# Patient Record
Sex: Female | Born: 1985 | Race: White | Hispanic: Yes | Marital: Single | Smoking: Never smoker
Health system: Southern US, Community
[De-identification: ages and names within clinical notes are randomized; demographics above are authoritative.]

## PROBLEM LIST (undated history)

## (undated) ENCOUNTER — Inpatient Hospital Stay (HOSPITAL_COMMUNITY): Payer: Self-pay

## (undated) DIAGNOSIS — D249 Benign neoplasm of unspecified breast: Secondary | ICD-10-CM

## (undated) HISTORY — DX: Benign neoplasm of unspecified breast: D24.9

## (undated) HISTORY — PX: NO PAST SURGERIES: SHX2092

---
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
## 2009-08-04 ENCOUNTER — Encounter: Admission: RE | Admit: 2009-08-04 | Discharge: 2009-08-04 | Payer: Self-pay | Admitting: Family Medicine

## 2011-11-18 ENCOUNTER — Emergency Department (HOSPITAL_COMMUNITY)
Admission: EM | Admit: 2011-11-18 | Discharge: 2011-11-18 | Disposition: A | Discharge status: Home / Self Care | Payer: Self-pay | Attending: Emergency Medicine | Admitting: Emergency Medicine

## 2011-11-18 ENCOUNTER — Encounter (HOSPITAL_COMMUNITY): Payer: Self-pay | Admitting: Emergency Medicine

## 2011-11-18 DIAGNOSIS — R109 Unspecified abdominal pain: Secondary | ICD-10-CM | POA: Insufficient documentation

## 2011-11-18 DIAGNOSIS — M549 Dorsalgia, unspecified: Secondary | ICD-10-CM | POA: Insufficient documentation

## 2011-11-18 LAB — URINALYSIS, ROUTINE W REFLEX MICROSCOPIC
Nitrite: NEGATIVE
pH: 7.5 (ref 5.0–8.0)

## 2011-11-18 LAB — COMPREHENSIVE METABOLIC PANEL
AST: 19 U/L (ref 0–37)
Albumin: 4.2 g/dL (ref 3.5–5.2)
Alkaline Phosphatase: 69 U/L (ref 39–117)
Calcium: 9.5 mg/dL (ref 8.4–10.5)
Chloride: 102 mEq/L (ref 96–112)
GFR calc non Af Amer: >90 mL/min (ref >90)
Glucose, Bld: 101 mg/dL — ABNORMAL HIGH (ref 70–99)
Total Bilirubin: 0.4 mg/dL (ref 0.3–1.2)

## 2011-11-18 LAB — CBC WITH DIFFERENTIAL/PLATELET
Basophils Absolute: 0 10*3/uL (ref 0.0–0.1)
Basophils Relative: 0 % (ref 0–1)
Eosinophils Absolute: 0.1 10*3/uL (ref 0.0–0.7)
Hemoglobin: 11.9 g/dL — ABNORMAL LOW (ref 12.0–15.0)
Lymphocytes Relative: 57 % — ABNORMAL HIGH (ref 12–46)
Monocytes Absolute: 0.4 10*3/uL (ref 0.1–1.0)
Monocytes Relative: 8 % (ref 3–12)
RBC: 4.26 MIL/uL (ref 3.87–5.11)

## 2011-11-18 NOTE — ED Notes (Signed)
Pt. Stated, I started having stomach pain and back pain that started yesterday

## 2011-11-18 NOTE — ED Notes (Signed)
Report of wait time.  Blankets offered

## 2013-02-22 ENCOUNTER — Inpatient Hospital Stay (HOSPITAL_COMMUNITY)
Admission: AD | Admit: 2013-02-22 | Discharge: 2013-02-22 | Disposition: A | Discharge status: Home / Self Care | Payer: Medicaid Other | Source: Ambulatory Visit | Attending: Obstetrics & Gynecology | Admitting: Obstetrics & Gynecology

## 2013-02-22 ENCOUNTER — Inpatient Hospital Stay (HOSPITAL_COMMUNITY): Payer: Medicaid Other

## 2013-02-22 ENCOUNTER — Encounter (HOSPITAL_COMMUNITY): Payer: Self-pay | Admitting: *Deleted

## 2013-02-22 ENCOUNTER — Inpatient Hospital Stay (HOSPITAL_COMMUNITY): Payer: Self-pay

## 2013-02-22 DIAGNOSIS — O9989 Other specified diseases and conditions complicating pregnancy, childbirth and the puerperium: Secondary | ICD-10-CM

## 2013-02-22 DIAGNOSIS — N949 Unspecified condition associated with female genital organs and menstrual cycle: Secondary | ICD-10-CM | POA: Insufficient documentation

## 2013-02-22 DIAGNOSIS — O99891 Other specified diseases and conditions complicating pregnancy: Secondary | ICD-10-CM | POA: Insufficient documentation

## 2013-02-22 DIAGNOSIS — R109 Unspecified abdominal pain: Secondary | ICD-10-CM | POA: Insufficient documentation

## 2013-02-22 DIAGNOSIS — O26899 Other specified pregnancy related conditions, unspecified trimester: Secondary | ICD-10-CM

## 2013-02-22 LAB — URINALYSIS, ROUTINE W REFLEX MICROSCOPIC
Bilirubin Urine: NEGATIVE
Glucose, UA: NEGATIVE mg/dL
Hgb urine dipstick: NEGATIVE
Nitrite: NEGATIVE
Protein, ur: NEGATIVE mg/dL
Specific Gravity, Urine: 1.015 (ref 1.005–1.030)
Urobilinogen, UA: 0.2 mg/dL (ref 0.0–1.0)
pH: 6 (ref 5.0–8.0)

## 2013-02-22 LAB — WET PREP, GENITAL
Clue Cells Wet Prep HPF POC: NONE SEEN
Yeast Wet Prep HPF POC: NONE SEEN

## 2013-02-22 LAB — CBC
MCHC: 33.8 g/dL (ref 30.0–36.0)
Platelets: 263 10*3/uL (ref 150–400)
RBC: 3.78 MIL/uL — ABNORMAL LOW (ref 3.87–5.11)
RDW: 12.9 % (ref 11.5–15.5)
WBC: 4.7 10*3/uL (ref 4.0–10.5)

## 2013-02-22 MED ORDER — ACETAMINOPHEN 500 MG PO TABS: 1000.0000 mg | ORAL_TABLET | Freq: Once | ORAL | Status: DC

## 2013-02-22 MED ORDER — PROMETHAZINE HCL 25 MG PO TABS: 25.0000 mg | ORAL_TABLET | Freq: Four times a day (QID) | ORAL | Status: DC | PRN

## 2013-02-22 NOTE — MAU Provider Note (Signed)
Attestation of Attending Supervision of Advanced Practitioner (PA/CNM/NP): Evaluation and management procedures were performed by the Advanced Practitioner under my supervision and collaboration.  I have reviewed the Advanced Practitioner's note and chart, and I agree with the management and plan.  Alliyah Roesler, MD, FACOG Attending Obstetrician & Gynecologist Faculty Practice, [facility]  

## 2013-02-22 NOTE — MAU Note (Signed)
C/o pelvic pain over bladder since this morning;

## 2013-02-22 NOTE — MAU Provider Note (Signed)
History     CSN: [id number]  Arrival date and time: 02/22/13 1532  First Provider Initiated Contact with Patient 02/22/13 1643    Chief Complaint  Patient presents with  . Pelvic Pain   Pelvic Pain The patient's primary symptoms include pelvic pain. Associated symptoms include nausea and vomiting. Pertinent negatives include no chills, dysuria, fever, frequency, headaches, sore throat or urgency.    A translator was used during this interview.  Ms. Brenda Washington is a 27 y/o G1P0 female that is 8w pregnant. Patient presents for left suprapelvic pain that started this morning. She say the pain feels like an intense period cramp. It ranges in intensity from 5/10 to 7/10. She has not tried any medications to relieve the pain. She says there is a clear discharge from her vagina that has soaked two pads. She also complains of N/V that starts in the morning and persists throughout the day. She states that she has been constipated for the past two weeks and her last BM was this morning.  OB History   Grav Para Term Preterm Abortions TAB SAB Ect Mult Living   1               Past Medical History  Diagnosis Date  . Anemia     History reviewed. No pertinent past surgical history.  Family History  Problem Relation Age of Onset  . Alcohol abuse Neg Hx   . Arthritis Neg Hx   . Asthma Neg Hx   . Birth defects Neg Hx   . Cancer Neg Hx   . COPD Neg Hx   . Depression Neg Hx   . Diabetes Neg Hx   . Drug abuse Neg Hx   . Early death Neg Hx   . Hearing loss Neg Hx   . Heart disease Neg Hx   . Hyperlipidemia Neg Hx   . Hypertension Neg Hx   . Kidney disease Neg Hx   . Learning disabilities Neg Hx   . Mental illness Neg Hx   . Mental retardation Neg Hx   . Miscarriages / Stillbirths Neg Hx   . Stroke Neg Hx   . Vision loss Neg Hx     History  Substance Use Topics  . Smoking status: Never Smoker   . Smokeless tobacco: Not on file  . Alcohol Use: No    Allergies:  Allergies   Allergen Reactions  . Iron Shortness Of Breath    Patient experienced a reaction to the iron supplement prescribed to her.    Prescriptions prior to admission  Medication Sig Dispense Refill  . Prenatal Vit-Fe Fumarate-FA (PRENATAL MULTIVITAMIN) TABS tablet Take 1 tablet by mouth daily at 12 noon.        Review of Systems  Constitutional: Negative for fever, chills and weight loss.  HENT: Negative for sore throat.   Respiratory: Negative for shortness of breath and wheezing.   Cardiovascular: Negative for chest pain and palpitations.  Gastrointestinal: Positive for nausea and vomiting. Negative for heartburn.  Genitourinary: Positive for pelvic pain. Negative for dysuria, urgency and frequency.  Neurological: Negative for headaches.   Physical Exam   Blood pressure 121/60, pulse 95, temperature 98.1 F (36.7 C), temperature source Oral, last menstrual period 12/26/2012.  Physical Exam  Constitutional: She is oriented to person, place, and time. She appears well-developed and well-nourished. No distress.  HENT:  Head: Normocephalic and atraumatic.  Cardiovascular: Normal rate, regular rhythm, normal heart sounds and intact distal pulses.   Respiratory:  Effort normal and breath sounds normal.  GI: Soft. Bowel sounds are normal. She exhibits no mass. There is tenderness. There is no rebound and no guarding.  Neurological: She is alert and oriented to person, place, and time.  Skin: Skin is warm and dry. No rash noted. She is not diaphoretic. No erythema. No pallor.  Psychiatric: She has a normal mood and affect.    MAU Course  Procedures   Assessment and Plan   UA shows patient is pregnant. Will do a serum hCG followed by and [place] to look for uterine pregnancy and viability. Pelvic exam will be performed to inspect cervix, look for discharge, culture for microbial growth, and not the size of uterus and ovaries.   Will prescribe colace for the patient along with encouraging a  diet higher in fiber and hydration. Also will refer patient to [facility] for prenatal care and start her on an iron free prenatal vitamin.   Assessment: 1. Abdominal pain complicating pregnancy, antepartum    Plan: D/C home in stable condition. Comfort measures and Tylenol PRN.  Follow-up Information   Follow up with [facility].      Follow up with [facility]. (as needed in emergencies)    Contact information:   [id number] [place] [address] [phone]        Medication List         prenatal multivitamin Tabs tablet  Take 1 tablet by mouth daily at 12 noon.     promethazine 25 MG tablet  Commonly known as:  PHENERGAN  Take 1 tablet (25 mg total) by mouth every 6 (six) hours as needed for nausea.       Dorathy Kinsman 02/22/2013, 6:29 PM

## 2013-02-23 LAB — GC/CHLAMYDIA PROBE AMP
CT Probe RNA: NEGATIVE
GC Probe RNA: NEGATIVE

## 2013-03-11 ENCOUNTER — Encounter (HOSPITAL_COMMUNITY): Payer: Self-pay

## 2013-03-11 ENCOUNTER — Inpatient Hospital Stay (HOSPITAL_COMMUNITY)
Admission: AD | Admit: 2013-03-11 | Discharge: 2013-03-11 | Disposition: A | Discharge status: Home / Self Care | Payer: Medicaid Other | Source: Ambulatory Visit | Attending: Obstetrics & Gynecology | Admitting: Obstetrics & Gynecology

## 2013-03-11 DIAGNOSIS — O2390 Unspecified genitourinary tract infection in pregnancy, unspecified trimester: Secondary | ICD-10-CM | POA: Insufficient documentation

## 2013-03-11 DIAGNOSIS — R42 Dizziness and giddiness: Secondary | ICD-10-CM | POA: Insufficient documentation

## 2013-03-11 DIAGNOSIS — N390 Urinary tract infection, site not specified: Secondary | ICD-10-CM | POA: Insufficient documentation

## 2013-03-11 DIAGNOSIS — O219 Vomiting of pregnancy, unspecified: Secondary | ICD-10-CM

## 2013-03-11 DIAGNOSIS — O210 Mild hyperemesis gravidarum: Secondary | ICD-10-CM | POA: Insufficient documentation

## 2013-03-11 LAB — POCT PREGNANCY, URINE: Preg Test, Ur: POSITIVE — AB

## 2013-03-11 LAB — URINE MICROSCOPIC-ADD ON

## 2013-03-11 LAB — URINALYSIS, ROUTINE W REFLEX MICROSCOPIC
Bilirubin Urine: NEGATIVE
Glucose, UA: NEGATIVE mg/dL
Ketones, ur: NEGATIVE mg/dL
pH: 7 (ref 5.0–8.0)

## 2013-03-11 MED ORDER — PROMETHAZINE HCL 25 MG PO TABS: 25.0000 mg | ORAL_TABLET | Freq: Four times a day (QID) | ORAL | Status: DC | PRN

## 2013-03-11 MED ORDER — NITROFURANTOIN MONOHYD MACRO 100 MG PO CAPS: 100.0000 mg | ORAL_CAPSULE | Freq: Two times a day (BID) | ORAL | Status: DC

## 2013-03-11 MED ORDER — ONDANSETRON HCL 4 MG PO TABS: 4.0000 mg | ORAL_TABLET | Freq: Three times a day (TID) | ORAL | Status: DC | PRN

## 2013-03-11 MED ORDER — ACETAMINOPHEN 325 MG PO TABS
650.0000 mg | ORAL_TABLET | Freq: Once | ORAL | Status: AC
  Administered 2013-03-11: 650 mg via ORAL
  Filled 2013-03-11: qty 2

## 2013-03-11 MED ORDER — ONDANSETRON 4 MG PO TBDP
4.0000 mg | ORAL_TABLET | Freq: Once | ORAL | Status: AC
  Administered 2013-03-11: 4 mg via ORAL
  Filled 2013-03-11: qty 1

## 2013-03-11 NOTE — MAU Provider Note (Signed)
History     CSN: [id number]  Arrival date and time: 03/11/13 1330   First Provider Initiated Contact with Patient 03/11/13 1427      Chief Complaint  Patient presents with  . Generalized Body Aches  . Headache  . Emesis   HPI  Ms. Brenda Washington is a 27 y.o. female G1P0 at [redacted]w[redacted]d who presents today with body aches, headache and vomiting.  [employer] line used # [id number]. She complains of dizziness, bodyaches and increased temperature for 4 days. She never measured her Fever so she does not know how high it got. She denies travel recently. She states she has been vomiting morning and evening. She denies abdominal pain or bleeding; no abnormal vaginal discharge. She has not tried anything for the HA today.   OB History   Grav Para Term Preterm Abortions TAB SAB Ect Mult Living   1         0      History reviewed. No pertinent past medical history.  History reviewed. No pertinent past surgical history.  History reviewed. No pertinent family history.  History  Substance Use Topics  . Smoking status: Never Smoker   . Smokeless tobacco: Not on file  . Alcohol Use: No    Allergies:  Allergies  Allergen Reactions  . Iron Anaphylaxis    Prescriptions prior to admission  Medication Sig Dispense Refill  . Prenatal Vit-Fe Fumarate-FA (PRENATAL MULTIVITAMIN) TABS tablet Take 1 tablet by mouth daily at 12 noon. Pt sometimes has shortness of breath when using this med.( see allergy)      . promethazine (PHENERGAN) 25 MG tablet Take 25 mg by mouth every 6 (six) hours as needed for nausea.       Results for orders placed during the hospital encounter of 03/11/13 (from the past 24 hour(s))  URINALYSIS, ROUTINE W REFLEX MICROSCOPIC     Status: Abnormal   Collection Time    03/11/13  1:55 PM      Result Value Range   Color, Urine YELLOW  YELLOW   APPearance CLOUDY (*) CLEAR   Specific Gravity, Urine 1.020  1.005 - 1.030   pH 7.0  5.0 - 8.0   Glucose, UA NEGATIVE  NEGATIVE  mg/dL   Hgb urine dipstick NEGATIVE  NEGATIVE   Bilirubin Urine NEGATIVE  NEGATIVE   Ketones, ur NEGATIVE  NEGATIVE mg/dL   Protein, ur NEGATIVE  NEGATIVE mg/dL   Urobilinogen, UA 0.2  0.0 - 1.0 mg/dL   Nitrite POSITIVE (*) NEGATIVE   Leukocytes, UA TRACE (*) NEGATIVE  URINE MICROSCOPIC-ADD ON     Status: Abnormal   Collection Time    03/11/13  1:55 PM      Result Value Range   Squamous Epithelial / LPF MANY (*) RARE   WBC, UA 3-6  <3 WBC/hpf   Bacteria, UA MANY (*) RARE   Urine-Other MUCOUS PRESENT    POCT PREGNANCY, URINE     Status: Abnormal   Collection Time    03/11/13  2:06 PM      Result Value Range   Preg Test, Ur POSITIVE (*) NEGATIVE   Review of Systems  Constitutional: Positive for chills. Negative for fever.  Gastrointestinal: Positive for nausea and vomiting. Negative for abdominal pain, diarrhea and constipation.  Genitourinary: Positive for frequency. Negative for dysuria and urgency.       No vaginal discharge. No vaginal bleeding. No dysuria.   Neurological: Positive for headaches.   Physical Exam   Blood  pressure 104/55, pulse 88, temperature 98.9 F (37.2 C), resp. rate 16, height 5' 3.5" (1.613 m), weight 125 lb 3.2 oz (56.79 kg), last menstrual period 12/26/2012, SpO2 100.00%.  Physical Exam  Constitutional: She is oriented to person, place, and time. She appears well-developed and well-nourished. No distress.  Eyes: Pupils are equal, round, and reactive to light.  Neck: Neck supple.  Cardiovascular: Normal rate.   Respiratory: Effort normal.  GI: Soft. She exhibits no distension. There is no tenderness. There is no rebound and no guarding.  Neurological: She is alert and oriented to person, place, and time.  Skin: Skin is warm and dry. She is not diaphoretic.    MAU Course  Procedures None  MDM Zofran 4 mg PO ODT Tylenol 650 mg PO   Patient tolerated PO crackers and fluids prior to discharge home.  Patient rates her headache 1/10 prior to  discharge   Assessment and Plan  A: Urinary tract infection Nausea and vomiting complicating pregnancy Headache.   P:  Discharge home RX: Macrobid        Zofran        Phenergan Return to MAU as needed, if symptoms worsen. Return for any abdominal pain or vaginal bleeding Take antibiotics until course is complete [facility] as soon as possible  [name] IRENE FNP-C  03/11/2013, 8:04 PM

## 2013-03-11 NOTE — MAU Note (Signed)
Patient states she has been feeling generalized body pain, headache for about 4 days. Has been vomiting for 3 weeks. Denies bleeding or discharge. Feels cold. States her urine smells bad.

## 2013-03-12 ENCOUNTER — Encounter (HOSPITAL_COMMUNITY): Payer: Self-pay

## 2013-03-13 LAB — URINE CULTURE

## 2013-04-08 ENCOUNTER — Encounter (HOSPITAL_COMMUNITY): Payer: Self-pay

## 2013-04-08 ENCOUNTER — Inpatient Hospital Stay (HOSPITAL_COMMUNITY)
Admission: AD | Admit: 2013-04-08 | Discharge: 2013-04-08 | Disposition: A | Discharge status: Home / Self Care | Payer: Medicaid Other | Source: Ambulatory Visit | Attending: Family Medicine | Admitting: Family Medicine

## 2013-04-08 DIAGNOSIS — K529 Noninfective gastroenteritis and colitis, unspecified: Secondary | ICD-10-CM

## 2013-04-08 DIAGNOSIS — O99891 Other specified diseases and conditions complicating pregnancy: Secondary | ICD-10-CM | POA: Insufficient documentation

## 2013-04-08 DIAGNOSIS — K5289 Other specified noninfective gastroenteritis and colitis: Secondary | ICD-10-CM

## 2013-04-08 DIAGNOSIS — O210 Mild hyperemesis gravidarum: Secondary | ICD-10-CM | POA: Insufficient documentation

## 2013-04-08 DIAGNOSIS — R109 Unspecified abdominal pain: Secondary | ICD-10-CM | POA: Insufficient documentation

## 2013-04-08 DIAGNOSIS — R197 Diarrhea, unspecified: Secondary | ICD-10-CM | POA: Insufficient documentation

## 2013-04-08 LAB — WET PREP, GENITAL: Clue Cells Wet Prep HPF POC: NONE SEEN

## 2013-04-08 LAB — URINALYSIS, ROUTINE W REFLEX MICROSCOPIC
Bilirubin Urine: NEGATIVE
Ketones, ur: 15 mg/dL — AB
Nitrite: NEGATIVE
pH: 8 (ref 5.0–8.0)

## 2013-04-08 MED ORDER — PROMETHAZINE HCL 25 MG PO TABS: 25.0000 mg | ORAL_TABLET | Freq: Four times a day (QID) | ORAL | Status: DC | PRN

## 2013-04-08 MED ORDER — ONDANSETRON 8 MG/NS 50 ML IVPB
8.0000 mg | Freq: Once | INTRAVENOUS | Status: AC
  Administered 2013-04-08: 8 mg via INTRAVENOUS
  Filled 2013-04-08: qty 8

## 2013-04-08 MED ORDER — LACTATED RINGERS IV BOLUS (SEPSIS)
1000.0000 mL | Freq: Once | INTRAVENOUS | Status: AC
  Administered 2013-04-08: 1000 mL via INTRAVENOUS

## 2013-04-08 NOTE — MAU Provider Note (Signed)
Chart reviewed and agree with management and plan.  

## 2013-04-08 NOTE — MAU Note (Signed)
Pt states via Eda, that she has noted pink blood when wiping only this am, and has had sharp pain x2 hours. Pain is slowly going away, rates 6/10. Was unable to walk because pain was so severe. Pain in lower abdominal area, began on left. Denies abnormal vaginal discharge prior to today. Had u/s at 6 weeks here. Also had vomiting with pain.

## 2013-04-08 NOTE — MAU Note (Signed)
Pt has had n/v/d x2 days.

## 2013-04-08 NOTE — MAU Note (Signed)
Urine has foul odor, pt states had uti 3 weeks ago

## 2013-04-08 NOTE — MAU Provider Note (Signed)
History     CSN: [id number]  Arrival date and time: 04/08/13 0844   None     Chief Complaint  Patient presents with  . Abdominal Pain  . Vaginal Bleeding   HPI  Ms. Brenda Washington is a 27 y.o. female G1P0 at Gestational Age: <None> who presents with N/V/D that started this morning. She has vomited three times and has had two episodes of diarrhea. She denies pain, however did noticed a small amount of spotting this morning when she wiped. She denies any other members of her family sick at this time.  Interpretor at bedside.   OB History   Grav Para Term Preterm Abortions TAB SAB Ect Mult Living   1         0      Past Medical History  Diagnosis Date  . Anemia     Past Surgical History  Procedure Laterality Date  . No past surgeries      Family History  Problem Relation Age of Onset  . Alcohol abuse Neg Hx   . Arthritis Neg Hx   . Asthma Neg Hx   . Birth defects Neg Hx   . Cancer Neg Hx   . COPD Neg Hx   . Depression Neg Hx   . Diabetes Neg Hx   . Drug abuse Neg Hx   . Early death Neg Hx   . Hearing loss Neg Hx   . Heart disease Neg Hx   . Hyperlipidemia Neg Hx   . Hypertension Neg Hx   . Kidney disease Neg Hx   . Learning disabilities Neg Hx   . Mental illness Neg Hx   . Mental retardation Neg Hx   . Miscarriages / Stillbirths Neg Hx   . Stroke Neg Hx   . Vision loss Neg Hx     History  Substance Use Topics  . Smoking status: Never Smoker   . Smokeless tobacco: Not on file  . Alcohol Use: No    Allergies:  Allergies  Allergen Reactions  . Iron Anaphylaxis and Shortness Of Breath    Patient experienced a reaction to the iron supplement prescribed to her.    No prescriptions prior to admission   Results for orders placed during the hospital encounter of 04/08/13 (from the past 24 hour(s))  URINALYSIS, ROUTINE W REFLEX MICROSCOPIC     Status: Abnormal   Collection Time    04/08/13  9:25 AM      Result Value Range   Color, Urine  YELLOW  YELLOW   APPearance HAZY (*) CLEAR   Specific Gravity, Urine 1.025  1.005 - 1.030   pH 8.0  5.0 - 8.0   Glucose, UA NEGATIVE  NEGATIVE mg/dL   Hgb urine dipstick NEGATIVE  NEGATIVE   Bilirubin Urine NEGATIVE  NEGATIVE   Ketones, ur 15 (*) NEGATIVE mg/dL   Protein, ur NEGATIVE  NEGATIVE mg/dL   Urobilinogen, UA 0.2  0.0 - 1.0 mg/dL   Nitrite NEGATIVE  NEGATIVE   Leukocytes, UA NEGATIVE  NEGATIVE  WET PREP, GENITAL     Status: Abnormal   Collection Time    04/08/13  9:59 AM      Result Value Range   Yeast Wet Prep HPF POC NONE SEEN  NONE SEEN   Trich, Wet Prep NONE SEEN  NONE SEEN   Clue Cells Wet Prep HPF POC NONE SEEN  NONE SEEN   WBC, Wet Prep HPF POC FEW (*) NONE SEEN  Review of Systems  Constitutional: Positive for chills. Negative for fever.  Gastrointestinal: Positive for nausea, vomiting and diarrhea. Negative for heartburn and abdominal pain.  Genitourinary: Negative for dysuria, urgency, frequency and hematuria.       No vaginal discharge. scant vaginal bleeding; when she wipes No dysuria.   Neurological: Negative for headaches.   Physical Exam   Blood pressure 108/73, pulse 83, temperature 97.8 F (36.6 C), temperature source Oral, resp. rate 18, last menstrual period 12/26/2012. Fetal heart tones by doppler 163 bpm   Physical Exam  Constitutional: She is oriented to person, place, and time. She appears well-developed and well-nourished. No distress.  HENT:  Head: Normocephalic.  Eyes: Pupils are equal, round, and reactive to light.  Neck: Neck supple.  Cardiovascular: Normal rate.   GI: Soft. She exhibits no distension. There is no tenderness. There is no rebound and no guarding.  Genitourinary: No vaginal discharge found.  Speculum exam: Vagina - Small amount of creamy discharge, no odor Cervix - No contact bleeding, no active bleeding, ectropion noted Bimanual exam: Cervix closed Uterus non tender, normal size for gestational age Adnexa non  tender, no masses bilaterally GC/Chlam, wet prep done Chaperone present for exam.   Neurological: She is alert and oriented to person, place, and time.  Skin: Skin is warm. She is not diaphoretic.  Psychiatric: Her behavior is normal.    MAU Course  Procedures None   MDM +fht LR bolus Zofran 8 mg IVPB Pt tolerated PO fluids and crackers. No episodes of vomiting or diarrhea in MAU.   Assessment and Plan   A:  1. Acute gastroenteritis    P: Discharge home RX: Phenergan  Return to MAU as needed, if symptoms worsen BRAT diet discussed  Gatorade for re-hydration  Merle Cirelli IRENE NP  04/08/2013, 10:51 AM

## 2013-04-09 LAB — GC/CHLAMYDIA PROBE AMP
CT Probe RNA: NEGATIVE
GC Probe RNA: NEGATIVE

## 2013-04-25 ENCOUNTER — Other Ambulatory Visit (HOSPITAL_COMMUNITY): Payer: Self-pay | Admitting: Family

## 2013-04-25 DIAGNOSIS — Z0489 Encounter for examination and observation for other specified reasons: Secondary | ICD-10-CM

## 2013-04-25 LAB — OB RESULTS CONSOLE RPR: RPR: NONREACTIVE

## 2013-04-25 LAB — OB RESULTS CONSOLE ABO/RH: RH Type: NEGATIVE

## 2013-04-25 LAB — OB RESULTS CONSOLE GC/CHLAMYDIA
CHLAMYDIA, DNA PROBE: NEGATIVE
GC PROBE AMP, GENITAL: NEGATIVE

## 2013-04-25 LAB — OB RESULTS CONSOLE HEPATITIS B SURFACE ANTIGEN: Hepatitis B Surface Ag: NEGATIVE

## 2013-04-25 LAB — OB RESULTS CONSOLE HIV ANTIBODY (ROUTINE TESTING): HIV: NONREACTIVE

## 2013-04-25 LAB — OB RESULTS CONSOLE ANTIBODY SCREEN: ANTIBODY SCREEN: NEGATIVE

## 2013-04-25 LAB — OB RESULTS CONSOLE RUBELLA ANTIBODY, IGM: Rubella: IMMUNE

## 2013-05-13 ENCOUNTER — Ambulatory Visit (HOSPITAL_COMMUNITY)
Admission: RE | Admit: 2013-05-13 | Discharge: 2013-05-13 | Disposition: A | Discharge status: Home / Self Care | Payer: Medicaid Other | Source: Ambulatory Visit | Attending: Family | Admitting: Family

## 2013-05-13 ENCOUNTER — Other Ambulatory Visit (HOSPITAL_COMMUNITY): Payer: Self-pay | Admitting: Family

## 2013-05-13 DIAGNOSIS — Z1389 Encounter for screening for other disorder: Secondary | ICD-10-CM

## 2013-05-13 DIAGNOSIS — Z0489 Encounter for examination and observation for other specified reasons: Secondary | ICD-10-CM

## 2013-05-13 DIAGNOSIS — Z3689 Encounter for other specified antenatal screening: Secondary | ICD-10-CM | POA: Insufficient documentation

## 2013-05-23 NOTE — L&D Delivery Note (Signed)
Operative Delivery Note At 10:46 PM a viable female was delivered via Vaginal, Vacuum Neurosurgeon).  Presentation: vertex; Position: Right,, Occiput,, Anterior; Station: +3.  Verbal consent: obtained from patient using help of Spanish interpreter.  Risks and benefits discussed in detail.  Risks include, but are not limited to the risks of anesthesia, bleeding, infection, damage to maternal tissues, fetal cephalhematoma.  There is also the risk of inability to effect vaginal delivery of the head, or shoulder dystocia that cannot be resolved by established maneuvers, leading to the need for emergency cesarean section.  APGAR: 8, 9; weight .   Placenta status: Intact, Spontaneous.   Cord: 3 vessels with the following complications: None.    Anesthesia: Epidural  Lacerations: 2nd degree Suture Repair: 3.0 vicryl Est. Blood Loss (mL): 400 ml  Mom to postpartum.  Baby to Couplet care / Skin to Skin.  Mom plans to breastfeed, Nexplanon for contraception. Does not desire circumcision for her female infant.   Verita Schneiders, MD, [place] Attending [place], [facility]

## 2013-05-24 ENCOUNTER — Other Ambulatory Visit: Payer: Self-pay | Admitting: Family Medicine

## 2013-05-24 DIAGNOSIS — Z0374 Encounter for suspected problem with fetal growth ruled out: Secondary | ICD-10-CM

## 2013-06-21 ENCOUNTER — Ambulatory Visit (HOSPITAL_COMMUNITY)
Admission: RE | Admit: 2013-06-21 | Discharge: 2013-06-21 | Disposition: A | Discharge status: Home / Self Care | Payer: Medicaid Other | Source: Ambulatory Visit | Attending: Family Medicine | Admitting: Family Medicine

## 2013-06-21 DIAGNOSIS — O3660X0 Maternal care for excessive fetal growth, unspecified trimester, not applicable or unspecified: Secondary | ICD-10-CM | POA: Insufficient documentation

## 2013-06-21 DIAGNOSIS — Z0374 Encounter for suspected problem with fetal growth ruled out: Secondary | ICD-10-CM

## 2013-06-21 DIAGNOSIS — Z3689 Encounter for other specified antenatal screening: Secondary | ICD-10-CM | POA: Insufficient documentation

## 2013-07-09 ENCOUNTER — Ambulatory Visit (HOSPITAL_COMMUNITY): Payer: Medicaid Other

## 2013-07-15 ENCOUNTER — Other Ambulatory Visit (HOSPITAL_COMMUNITY): Payer: Self-pay | Admitting: Obstetrics and Gynecology

## 2013-07-15 ENCOUNTER — Other Ambulatory Visit (HOSPITAL_COMMUNITY): Payer: Self-pay | Admitting: *Deleted

## 2013-07-15 DIAGNOSIS — N63 Unspecified lump in unspecified breast: Secondary | ICD-10-CM

## 2013-07-16 ENCOUNTER — Ambulatory Visit (HOSPITAL_COMMUNITY): Payer: Medicaid Other

## 2013-07-18 ENCOUNTER — Other Ambulatory Visit: Payer: Medicaid Other

## 2013-07-22 ENCOUNTER — Other Ambulatory Visit: Payer: Medicaid Other

## 2013-07-29 ENCOUNTER — Encounter (HOSPITAL_COMMUNITY): Payer: Self-pay

## 2013-07-30 ENCOUNTER — Encounter (HOSPITAL_COMMUNITY): Payer: Self-pay

## 2013-07-30 ENCOUNTER — Ambulatory Visit
Admission: RE | Admit: 2013-07-30 | Discharge: 2013-07-30 | Disposition: A | Discharge status: Home / Self Care | Payer: No Typology Code available for payment source | Source: Ambulatory Visit | Attending: Obstetrics and Gynecology | Admitting: Obstetrics and Gynecology

## 2013-07-30 ENCOUNTER — Ambulatory Visit
Admission: RE | Admit: 2013-07-30 | Discharge: 2013-07-30 | Disposition: A | Discharge status: Home / Self Care | Payer: Medicaid Other | Source: Ambulatory Visit | Attending: Obstetrics and Gynecology | Admitting: Obstetrics and Gynecology

## 2013-07-30 ENCOUNTER — Ambulatory Visit (HOSPITAL_COMMUNITY)
Admission: RE | Admit: 2013-07-30 | Discharge: 2013-07-30 | Disposition: A | Discharge status: Home / Self Care | Payer: Medicaid Other | Source: Ambulatory Visit | Attending: Obstetrics and Gynecology | Admitting: Obstetrics and Gynecology

## 2013-07-30 ENCOUNTER — Encounter (INDEPENDENT_AMBULATORY_CARE_PROVIDER_SITE_OTHER): Payer: Self-pay

## 2013-07-30 VITALS — BP 102/74 | Temp 98.9°F | Ht 63.25 in | Wt 148.4 lb

## 2013-07-30 DIAGNOSIS — Z1239 Encounter for other screening for malignant neoplasm of breast: Secondary | ICD-10-CM

## 2013-07-30 DIAGNOSIS — N63 Unspecified lump in unspecified breast: Secondary | ICD-10-CM | POA: Insufficient documentation

## 2013-07-30 NOTE — Patient Instructions (Signed)
Taught Brenda Washington how to perform BSE and gave educational materials to take home. Patient did not need a Pap smear today due to last Pap smear was November 2014 per patient. Let her know BCCCP will cover Pap smears every 3 years unless has a history of abnormal Pap smears. Referred patient to the [facility] for bilateral breast ultrasound. Appointment scheduled for Tuesday, July 30, 2013 at 1215. Patient aware of appointment and will be there. Brenda Washington verbalized understanding.  Mare Ludtke, Arvil Ch[place], RN 10:24 AM

## 2013-07-30 NOTE — Progress Notes (Signed)
Complaints of bilateral breast lumps.  Pap Smear:  Pap smear not completed today. Last Pap smear was November 2014 at the [facility] and normal per patient. Per patient has no history of an abnormal Pap smear. No Pap smear results in EPIC.   Physical exam: Breasts Breasts symmetrical. No skin abnormalities bilateral breasts. No nipple retraction bilateral breasts. No nipple discharge bilateral breasts. No lymphadenopathy. Palpated a moveable lump within the left breast at 11 o'clock 10 cm from the nipple. Palpated three lumps within the right breast at 9 o'clock 6 cm from the nipple, moveable lump at 11 o'clock 7 cm from the nipple and 12 o'clock right above the areola. Complaints of tenderness when palpated the left outer breast. Referred patient to the [facility] for bilateral breast ultrasound. Appointment scheduled for Tuesday, July 30, 2013 at 1215.   Pelvic/Bimanual No Pap smear completed today since last Pap smear was November 2014 per patient. Pap smear not indicated per BCCCP guidelines.

## 2013-09-05 LAB — OB RESULTS CONSOLE GBS: STREP GROUP B AG: NEGATIVE

## 2013-09-08 ENCOUNTER — Encounter (HOSPITAL_COMMUNITY): Payer: Self-pay

## 2013-09-08 ENCOUNTER — Inpatient Hospital Stay (HOSPITAL_COMMUNITY)
Admission: AD | Admit: 2013-09-08 | Discharge: 2013-09-08 | Disposition: A | Discharge status: Home / Self Care | Payer: Medicaid Other | Source: Ambulatory Visit | Attending: Obstetrics & Gynecology | Admitting: Obstetrics & Gynecology

## 2013-09-08 DIAGNOSIS — R109 Unspecified abdominal pain: Secondary | ICD-10-CM | POA: Insufficient documentation

## 2013-09-08 DIAGNOSIS — O0930 Supervision of pregnancy with insufficient antenatal care, unspecified trimester: Secondary | ICD-10-CM | POA: Insufficient documentation

## 2013-09-08 DIAGNOSIS — O479 False labor, unspecified: Secondary | ICD-10-CM

## 2013-09-08 DIAGNOSIS — O4700 False labor before 37 completed weeks of gestation, unspecified trimester: Secondary | ICD-10-CM | POA: Insufficient documentation

## 2013-09-08 LAB — URINALYSIS, ROUTINE W REFLEX MICROSCOPIC
Bilirubin Urine: NEGATIVE
Glucose, UA: NEGATIVE mg/dL
Hgb urine dipstick: NEGATIVE
Ketones, ur: NEGATIVE mg/dL
LEUKOCYTES UA: NEGATIVE
NITRITE: NEGATIVE
PROTEIN: NEGATIVE mg/dL
SPECIFIC GRAVITY, URINE: 1.01 (ref 1.005–1.030)
UROBILINOGEN UA: 0.2 mg/dL (ref 0.0–1.0)
pH: 5.5 (ref 5.0–8.0)

## 2013-09-08 NOTE — Discharge Instructions (Signed)
Informacin sobre el parto prematuro  (Preterm Labor Information) El parto prematuro comienza antes de la semana 37 de embarazo. La duracin de un embarazo normal es de 39 a 41 semanas.  CAUSAS  Generalmente no hay una causa que pueda identificarse del motivo por el que una mujer comienza un trabajo de parto prematuro. Sin embargo, una de las causas conocidas ms frecuentes son las infecciones. Las infecciones del tero, el cuello, la vagina, el lquido amnitico, la vejiga, los riones y hasta de los pulmones (neumona) pueden hacer que el trabajo de parto se inicie. Otras causas que pueden sospecharse son:   Infecciones urogenitales, como infecciones por hongos y vaginosis bacteriana.   Anormalidades uterinas (forma del tero, sptum uterino, fibromas, hemorragias en la placenta).   Un cuello que ha sido operado (puede ser que no permanezca cerrado).   Malformaciones del feto.   Gestaciones mltiples (mellizos, trillizos y ms).   Ruptura del saco amnitico.  FACTORES DE RIESGO   Historia previa de parto prematuro.   Tener ruptura prematura de las membranas (RPM).   La placenta cubre la abertura del cuello (placenta previa).   La placenta se separa del tero (abrupcin placentaria).   El cuello es demasiado dbil para contener al beb en el tero (cuello incompetente).   Hay mucho lquido en el saco amnitico (polihidramnios).   Consumo de drogas o hbito de fumar durante el embarazo.   No aumentar de peso lo suficiente durante el embarazo.   Mujeres menores de 18 aos o mayores de 35 aos.   Nivel socioeconmico bajo.   Pertenecer a la raza afroamericana. SNTOMAS  Los signos y sntomas del trabajo de parto prematuro son:   Clicos similares a los menstruales, dolor abdominal o dolor de espalda.  Contracciones uterinas regulares, tan frecuentes como seis por hora, sin importar su intensidad (pueden ser suaves o dolorosas).  Contracciones que comienzan  en la parte superior del tero y se expanden hacia abajo, a la zona inferior del abdomen y la espalda.   Sensacin de aumento de presin en la pelvis.   Aparece una secrecin acuosa o sanguinolenta por la vagina.  TRATAMIENTO  Segn el tiempo del embarazo y otras circunstancias, el mdico puede indicar reposo en cama. Si es necesario, le indicarn medicamentos para detener las contracciones y para madurar los pulmones del feto. Si el trabajo de parto se inicia antes de las 34 semanas de embarazo, se recomienda la hospitalizacin. El tratamiento depende de las condiciones en que se encuentren usted y el feto.  QU DEBE HACER SI PIENSA QUE EST EN TRABAJO DE PARTO PREMATURO?  Comunquese con su mdico inmediatamente. Debe concurrir al hospital para ser controlada inmediatamente.  CMO PUEDE EVITAR EL TRABAJO DE PARTO PREMATURO EN FUTUROS EMBARAZOS?  Usted debe:   Si fuma, abandonar el hbito.  Mantener un peso saludable y evitar sustancias qumicas y drogas innecesarias.  Controlar todo tipo de infeccin.  Informe a su mdico si tiene una historia conocida de trabajo de parto prematuro. Document Released: 08/16/2007 Document Revised: 01/09/2013 [employer] Patient Information 2014 [employer].  

## 2013-09-08 NOTE — MAU Note (Signed)
Contractions  

## 2013-09-08 NOTE — MAU Provider Note (Signed)
History  Chief Complaint:  Labor Eval  Brenda Washington is a 28 y.o. G1P0 female at [redacted]w[redacted]d presenting w/ report of feeling like abdomen is intermittently tightening and sore today.   Reports active fetal movement, contractions: regular, vaginal bleeding: none, membranes: intact. Denies uti s/s, abnormal/malodorous vag d/c or vulvovaginal itching/irritation.  Prenatal care at [facility].  Next appt 4/29. Pregnancy complicated by language barrier, late pnc, breast lumps x 3, uti, anemia, Rh-, varicella non-imm  Obstetrical History: OB History   Grav Para Term Preterm Abortions TAB SAB Ect Mult Living   1         0      Past Medical History: No past medical history on file.  Past Surgical History: Past Surgical History  Procedure Laterality Date  . No past surgeries      Social History: History   Social History  . Marital Status: Single    Spouse Name: N/A    Number of Children: N/A  . Years of Education: N/A   Social History Main Topics  . Smoking status: Never Smoker   . Smokeless tobacco: Not on file  . Alcohol Use: No  . Drug Use: No  . Sexual Activity: Yes    Birth Control/ Protection: None   Other Topics Concern  . Not on file   Social History Narrative   ** Merged History Encounter **        Allergies: Allergies  Allergen Reactions  . Iron Anaphylaxis and Shortness Of Breath    Patient experienced a reaction to the iron supplement prescribed to her.    Prescriptions prior to admission  Medication Sig Dispense Refill  . Prenatal Vit-Fe Fumarate-FA (PRENATAL MULTIVITAMIN) TABS tablet Take 1 tablet by mouth daily at 12 noon.      . promethazine (PHENERGAN) 25 MG tablet Take 1 tablet (25 mg total) by mouth every 6 (six) hours as needed for nausea or vomiting.  20 tablet  0    Review of Systems  Pertinent pos/neg as indicated in HPI  Physical Exam  Blood pressure 129/80, pulse 92, temperature 98.5 F (36.9 C), temperature source Oral, resp. rate 20,  height 5\' 3"  (1.6 m), weight 70.761 kg (156 lb), last menstrual period 12/26/2012, SpO2 100.00%. General appearance: alert, cooperative and no distress Lungs: clear to auscultation bilaterally, normal effort Heart: regular rate and rhythm Abdomen: gravid, soft, non-tender Extremities: No edema DTR's 2+  Spec exam: n/a Cultures/Specimens: n/a   SVE: 1/80/-3, to maternal Lt Presentation: cephalic  No change on repeat exam 1hr later  Fetal monitoring: FHR: 135 bpm, variability: moderate,  Accelerations: Present,  decelerations:  Absent Uterine activity: 1-2  MAU Course  NST SVE x 2 UA PO hydration  Labs:  Results for orders placed during the hospital encounter of 09/08/13 (from the past 24 hour(s))  URINALYSIS, ROUTINE W REFLEX MICROSCOPIC     Status: None   Collection Time    09/08/13  2:16 AM      Result Value Ref Range   Color, Urine YELLOW  YELLOW   APPearance CLEAR  CLEAR   Specific Gravity, Urine 1.010  1.005 - 1.030   pH 5.5  5.0 - 8.0   Glucose, UA NEGATIVE  NEGATIVE mg/dL   Hgb urine dipstick NEGATIVE  NEGATIVE   Bilirubin Urine NEGATIVE  NEGATIVE   Ketones, ur NEGATIVE  NEGATIVE mg/dL   Protein, ur NEGATIVE  NEGATIVE mg/dL   Urobilinogen, UA 0.2  0.0 - 1.0 mg/dL   Nitrite NEGATIVE  NEGATIVE   Leukocytes, UA NEGATIVE  NEGATIVE    Imaging:  n/a  Assessment and Plan  A:  [redacted]w[redacted]d SIUP  G1P0  Preterm uc's  Cat 1 FHR P:  D/C home  Reviewed ptl s/s, fkc  Recommended increasing po fluid intake  Keep next appt at [facility] on 4/29 as scheduled   Hale Drone Tayona Sarnowski CNM,WHNP-BC 4/19/20152:46 AM

## 2013-09-09 NOTE — MAU Provider Note (Signed)
Attestation of Attending Supervision of Advanced Practitioner (CNM/NP): Evaluation and management procedures were performed by the Advanced Practitioner under my supervision and collaboration. I have reviewed the Advanced Practitioner's note and chart, and I agree with the management and plan.  Valen Mascaro H Lilana Blasko 1:30 PM

## 2013-10-03 ENCOUNTER — Other Ambulatory Visit (HOSPITAL_COMMUNITY): Payer: Self-pay | Admitting: Physician Assistant

## 2013-10-03 DIAGNOSIS — O480 Post-term pregnancy: Secondary | ICD-10-CM

## 2013-10-07 ENCOUNTER — Ambulatory Visit (HOSPITAL_COMMUNITY)
Admission: RE | Admit: 2013-10-07 | Discharge: 2013-10-07 | Disposition: A | Discharge status: Home / Self Care | Payer: Medicaid Other | Source: Ambulatory Visit | Attending: Physician Assistant | Admitting: Physician Assistant

## 2013-10-07 DIAGNOSIS — Z3689 Encounter for other specified antenatal screening: Secondary | ICD-10-CM | POA: Insufficient documentation

## 2013-10-07 DIAGNOSIS — O480 Post-term pregnancy: Secondary | ICD-10-CM | POA: Insufficient documentation

## 2013-10-08 ENCOUNTER — Telehealth (HOSPITAL_COMMUNITY): Payer: Self-pay | Admitting: *Deleted

## 2013-10-08 ENCOUNTER — Encounter (HOSPITAL_COMMUNITY): Payer: Self-pay | Admitting: *Deleted

## 2013-10-08 NOTE — Telephone Encounter (Signed)
Preadmission screen Interpreter number [phone]

## 2013-10-08 NOTE — Telephone Encounter (Signed)
Preadmission screen  

## 2013-10-08 NOTE — Telephone Encounter (Signed)
Interpreter number [phone]

## 2013-10-13 ENCOUNTER — Inpatient Hospital Stay (HOSPITAL_COMMUNITY)
Admission: RE | Admit: 2013-10-13 | Discharge: 2013-10-16 | DRG: 775 | Disposition: A | Discharge status: Home / Self Care | Payer: Medicaid Other | Source: Ambulatory Visit | Attending: Obstetrics & Gynecology | Admitting: Obstetrics & Gynecology

## 2013-10-13 ENCOUNTER — Encounter (HOSPITAL_COMMUNITY): Payer: Self-pay

## 2013-10-13 VITALS — BP 116/83 | HR 84 | Temp 98.0°F | Resp 16 | Ht 66.0 in | Wt 162.0 lb

## 2013-10-13 DIAGNOSIS — O9902 Anemia complicating childbirth: Secondary | ICD-10-CM | POA: Diagnosis present

## 2013-10-13 DIAGNOSIS — O360990 Maternal care for other rhesus isoimmunization, unspecified trimester, not applicable or unspecified: Secondary | ICD-10-CM | POA: Diagnosis present

## 2013-10-13 DIAGNOSIS — O479 False labor, unspecified: Secondary | ICD-10-CM | POA: Diagnosis present

## 2013-10-13 DIAGNOSIS — D649 Anemia, unspecified: Secondary | ICD-10-CM | POA: Diagnosis present

## 2013-10-13 DIAGNOSIS — O139 Gestational [pregnancy-induced] hypertension without significant proteinuria, unspecified trimester: Secondary | ICD-10-CM | POA: Diagnosis present

## 2013-10-13 DIAGNOSIS — O480 Post-term pregnancy: Secondary | ICD-10-CM | POA: Diagnosis present

## 2013-10-13 DIAGNOSIS — N63 Unspecified lump in unspecified breast: Secondary | ICD-10-CM

## 2013-10-13 LAB — CBC
HCT: 32.6 % — ABNORMAL LOW (ref 36.0–46.0)
Hemoglobin: 11.1 g/dL — ABNORMAL LOW (ref 12.0–15.0)
MCH: 29.8 pg (ref 26.0–34.0)
MCHC: 34 g/dL (ref 30.0–36.0)
MCV: 87.4 fL (ref 78.0–100.0)
Platelets: 294 K/uL (ref 150–400)
RBC: 3.73 MIL/uL — ABNORMAL LOW (ref 3.87–5.11)
RDW: 14.7 % (ref 11.5–15.5)
WBC: 9.3 K/uL (ref 4.0–10.5)

## 2013-10-13 LAB — COMPREHENSIVE METABOLIC PANEL WITH GFR
ALT: 11 U/L (ref 0–35)
AST: 22 U/L (ref 0–37)
Albumin: 2.8 g/dL — ABNORMAL LOW (ref 3.5–5.2)
Alkaline Phosphatase: 158 U/L — ABNORMAL HIGH (ref 39–117)
BUN: 10 mg/dL (ref 6–23)
CO2: 20 meq/L (ref 19–32)
Calcium: 9 mg/dL (ref 8.4–10.5)
Chloride: 99 meq/L (ref 96–112)
Creatinine, Ser: 0.61 mg/dL (ref 0.50–1.10)
GFR calc Af Amer: >90 mL/min
GFR calc non Af Amer: >90 mL/min
Glucose, Bld: 114 mg/dL — ABNORMAL HIGH (ref 70–99)
Potassium: 4.4 meq/L (ref 3.7–5.3)
Sodium: 136 meq/L — ABNORMAL LOW (ref 137–147)
Total Bilirubin: <0.2 mg/dL — ABNORMAL LOW (ref 0.3–1.2)
Total Protein: 6.6 g/dL (ref 6.0–8.3)

## 2013-10-13 MED ORDER — LIDOCAINE HCL (PF) 1 % IJ SOLN
30.0000 mL | INTRAMUSCULAR | Status: DC | PRN
  Filled 2013-10-13: qty 30

## 2013-10-13 MED ORDER — FLEET ENEMA 7-19 GM/118ML RE ENEM: 1.0000 | ENEMA | RECTAL | Status: DC | PRN

## 2013-10-13 MED ORDER — OXYCODONE-ACETAMINOPHEN 5-325 MG PO TABS: 1.0000 | ORAL_TABLET | ORAL | Status: DC | PRN

## 2013-10-13 MED ORDER — IBUPROFEN 600 MG PO TABS
600.0000 mg | ORAL_TABLET | Freq: Four times a day (QID) | ORAL | Status: DC | PRN
  Administered 2013-10-15: 600 mg via ORAL
  Filled 2013-10-13: qty 1

## 2013-10-13 MED ORDER — LACTATED RINGERS IV SOLN
INTRAVENOUS | Status: DC
  Administered 2013-10-13 – 2013-10-14 (×3): via INTRAVENOUS

## 2013-10-13 MED ORDER — OXYTOCIN 40 UNITS IN LACTATED RINGERS INFUSION - SIMPLE MED
62.5000 mL/h | INTRAVENOUS | Status: DC
  Administered 2013-10-14: 62.5 mL/h via INTRAVENOUS
  Filled 2013-10-13: qty 1000

## 2013-10-13 MED ORDER — OXYTOCIN BOLUS FROM INFUSION: 500.0000 mL | INTRAVENOUS | Status: DC

## 2013-10-13 MED ORDER — CITRIC ACID-SODIUM CITRATE 334-500 MG/5ML PO SOLN
30.0000 mL | ORAL | Status: DC | PRN
  Filled 2013-10-13: qty 15

## 2013-10-13 MED ORDER — ACETAMINOPHEN 325 MG PO TABS: 650.0000 mg | ORAL_TABLET | ORAL | Status: DC | PRN

## 2013-10-13 MED ORDER — ONDANSETRON HCL 4 MG/2ML IJ SOLN
4.0000 mg | Freq: Four times a day (QID) | INTRAMUSCULAR | Status: DC | PRN
  Administered 2013-10-14: 4 mg via INTRAVENOUS
  Filled 2013-10-13: qty 2

## 2013-10-13 MED ORDER — LACTATED RINGERS IV SOLN: 500.0000 mL | INTRAVENOUS | Status: DC | PRN

## 2013-10-13 NOTE — H&P (Signed)
LABOR ADMISSION HISTORY AND PHYSICAL  Kaydra Alfonso-Ramos is a 28 y.o. female G1P0 with IUP at 40.2 wks confirmed by 7 wk ultrasound presenting for postdate induction that was based on LMP.  Proceeded with induction of labor due to gestational hypertension noted on admission blood pressures x 2 and report of headache.    Prenatal care at [facility].  Pregnancy complicated by language barrier, late pnc, breast lumps x 3, uti, anemia, Rh-, varicella non-imm  [facility]  Prenatal History/Complications:  Past Medical History: Past Medical History  Diagnosis Date  . Fibroadenoma of breast     both    Past Surgical History: Past Surgical History  Procedure Laterality Date  . No past surgeries      Obstetrical History: OB History   Grav Para Term Preterm Abortions TAB SAB Ect Mult Living   1         0      Social History: History   Social History  . Marital Status: Single    Spouse Name: N/A    Number of Children: N/A  . Years of Education: N/A   Social History Main Topics  . Smoking status: Never Smoker   . Smokeless tobacco: Never Used  . Alcohol Use: No  . Drug Use: No  . Sexual Activity: Yes    Birth Control/ Protection: None   Other Topics Concern  . Not on file   Social History Narrative   ** Merged History Encounter **        Family History: Family History  Problem Relation Age of Onset  . Alcohol abuse Neg Hx   . Arthritis Neg Hx   . Asthma Neg Hx   . Birth defects Neg Hx   . Cancer Neg Hx   . COPD Neg Hx   . Depression Neg Hx   . Diabetes Neg Hx   . Drug abuse Neg Hx   . Early death Neg Hx   . Hearing loss Neg Hx   . Heart disease Neg Hx   . Hyperlipidemia Neg Hx   . Hypertension Neg Hx   . Kidney disease Neg Hx   . Learning disabilities Neg Hx   . Mental illness Neg Hx   . Mental retardation Neg Hx   . Miscarriages / Stillbirths Neg Hx   . Stroke Neg Hx   . Vision loss Neg Hx     Allergies: Allergies  Allergen Reactions   . Iron Anaphylaxis and Shortness Of Breath    Patient experienced a reaction to the iron supplement prescribed to her.     (Not in a hospital admission)   Review of Systems   All systems reviewed and negative except as stated in HPI  Last menstrual period 12/26/2012. General appearance:  Lungs: clear to auscultation bilaterally Heart: regular rate and rhythm Abdomen: soft, non-tender; bowel sounds normal Pelvic: 2/75/-2; vertex Extremities: Homans sign is negative, no sign of DVT DTR's, no pedal edema Presentation:  Fetal monitoring 140's, +accels, reactive Uterine activity irregular blood pressure     Prenatal labs: ABO, Rh: O/Negative/-- (12/04 0000) Antibody: Negative (12/04 0000) Rubella:   RPR: Nonreactive (12/04 0000)  HBsAg: Negative (12/04 0000)  HIV: Non-reactive (12/04 0000)  GBS: Negative (04/16 0000)  1 hr Glucola 88 Genetic screening   Anatomy US normal    Prenatal Transfer Tool  Maternal Diabetes: No Genetic Screening:  Maternal Ultrasounds/Referrals: Normal Fetal Ultrasounds or other Referrals:  Other: NST normal Maternal Substance Abuse:  Significant Maternal Medications:  None Significant Maternal Lab Results: Lab values include: Group B Strep negative, Rh negative     No results found for this or any previous visit (from the past 24 hour(s)).  Assessment: Danessa Alfonso-Ramos is a 28 y.o. G1P0 at 24.2 w here for IOL for gestational hypertension.   Plan:   Admit to [facility] Suites Foley bulb inserted without difficulty PreX labs Anticipate NSVD  Rosemarie Ax 10/13/2013, 3:41 PM   I examined pt and agree with documentation above and resident plan of care. [place], CNM

## 2013-10-14 ENCOUNTER — Encounter (HOSPITAL_COMMUNITY): Payer: Medicaid Other | Admitting: Anesthesiology

## 2013-10-14 ENCOUNTER — Encounter (HOSPITAL_COMMUNITY): Payer: Self-pay

## 2013-10-14 ENCOUNTER — Inpatient Hospital Stay (HOSPITAL_COMMUNITY): Payer: Medicaid Other | Admitting: Anesthesiology

## 2013-10-14 DIAGNOSIS — O139 Gestational [pregnancy-induced] hypertension without significant proteinuria, unspecified trimester: Secondary | ICD-10-CM

## 2013-10-14 DIAGNOSIS — O480 Post-term pregnancy: Secondary | ICD-10-CM

## 2013-10-14 LAB — RPR

## 2013-10-14 LAB — PROTEIN / CREATININE RATIO, URINE
CREATININE, URINE: 56.47 mg/dL
PROTEIN CREATININE RATIO: 0.09 (ref 0.00–0.15)
Total Protein, Urine: 5.3 mg/dL

## 2013-10-14 MED ORDER — PHENYLEPHRINE 40 MCG/ML (10ML) SYRINGE FOR IV PUSH (FOR BLOOD PRESSURE SUPPORT)
80.0000 ug | PREFILLED_SYRINGE | INTRAVENOUS | Status: DC | PRN
  Filled 2013-10-14: qty 2

## 2013-10-14 MED ORDER — EPHEDRINE 5 MG/ML INJ
10.0000 mg | INTRAVENOUS | Status: DC | PRN
  Filled 2013-10-14: qty 2
  Filled 2013-10-14: qty 4

## 2013-10-14 MED ORDER — TERBUTALINE SULFATE 1 MG/ML IJ SOLN: 0.2500 mg | Freq: Once | INTRAMUSCULAR | Status: AC | PRN

## 2013-10-14 MED ORDER — FENTANYL 2.5 MCG/ML BUPIVACAINE 1/10 % EPIDURAL INFUSION (WH - ANES)
14.0000 mL/h | INTRAMUSCULAR | Status: DC | PRN
  Administered 2013-10-14 (×2): 14 mL/h via EPIDURAL

## 2013-10-14 MED ORDER — FENTANYL 2.5 MCG/ML BUPIVACAINE 1/10 % EPIDURAL INFUSION (WH - ANES)
14.0000 mL/h | INTRAMUSCULAR | Status: DC | PRN
  Administered 2013-10-14: 14 mL/h via EPIDURAL
  Filled 2013-10-14 (×3): qty 125

## 2013-10-14 MED ORDER — OXYTOCIN 40 UNITS IN LACTATED RINGERS INFUSION - SIMPLE MED
1.0000 m[IU]/min | INTRAVENOUS | Status: DC
  Administered 2013-10-14: 2 m[IU]/min via INTRAVENOUS

## 2013-10-14 MED ORDER — MISOPROSTOL 200 MCG PO TABS
ORAL_TABLET | ORAL | Status: AC
  Administered 2013-10-14: 200 ug via VAGINAL
  Filled 2013-10-14: qty 5

## 2013-10-14 MED ORDER — EPHEDRINE 5 MG/ML INJ
10.0000 mg | INTRAVENOUS | Status: DC | PRN
  Filled 2013-10-14: qty 2

## 2013-10-14 MED ORDER — SODIUM BICARBONATE 8.4 % IV SOLN
INTRAVENOUS | Status: DC | PRN
  Administered 2013-10-14: 5 mL via EPIDURAL

## 2013-10-14 MED ORDER — LACTATED RINGERS IV SOLN
500.0000 mL | Freq: Once | INTRAVENOUS | Status: AC
  Administered 2013-10-14: 500 mL via INTRAVENOUS

## 2013-10-14 MED ORDER — PHENYLEPHRINE 40 MCG/ML (10ML) SYRINGE FOR IV PUSH (FOR BLOOD PRESSURE SUPPORT)
80.0000 ug | PREFILLED_SYRINGE | INTRAVENOUS | Status: DC | PRN
  Filled 2013-10-14: qty 2
  Filled 2013-10-14: qty 10

## 2013-10-14 MED ORDER — DIPHENHYDRAMINE HCL 50 MG/ML IJ SOLN: 12.5000 mg | INTRAMUSCULAR | Status: DC | PRN

## 2013-10-14 MED ORDER — MISOPROSTOL 200 MCG PO TABS
1000.0000 ug | ORAL_TABLET | Freq: Once | ORAL | Status: AC
  Administered 2013-10-14: 200 ug via VAGINAL

## 2013-10-14 NOTE — Progress Notes (Signed)
Brenda Washington is a 28 y.o. G1P0 at [redacted]w[redacted]d by ultrasound admitted for active labor  Subjective: Pt feeling pressure, urge to push.  Family in room supporting pt during pushing.  Objective: BP 158/79  Pulse 126  Temp(Src) 99.8 F (37.7 C) (Oral)  Resp 20  Ht 5\' 6"  (1.676 m)  Wt 73.483 kg (162 lb)  BMI 26.16 kg/m2  SpO2 100%  LMP 12/26/2012 I/O last 3 completed shifts: In: -  Out: 1350 [Urine:1350] Total I/O In: -  Out: 250 [Urine:250]  FHT:  FHR: 150 bpm, variability: moderate,  accelerations:  Abscent,  decelerations:  Present early UC:   regular, every 3 minutes SVE:   Dilation: 10 Effacement (%): 100 Station: +2 Exam by:: leftwich-kirby, cnm Pt pushing well  Labs: Lab Results  Component Value Date   WBC 9.3 10/13/2013   HGB 11.1* 10/13/2013   HCT 32.6* 10/13/2013   MCV 87.4 10/13/2013   PLT 294 10/13/2013    Assessment / Plan: Augmentation of labor, progressing well  Labor: Progressing normally Preeclampsia:  n/a Fetal Wellbeing:  Category I Pain Control:  Epidural I/D:  n/a Anticipated MOD:  NSVD  Trish Mancinelli A Leftwich-Kirby 10/14/2013, 7:40 PM

## 2013-10-14 NOTE — Progress Notes (Signed)
Brenda Washington is a 28 y.o. G1P0 at [redacted]w[redacted]d admitted for active labor  Subjective: Patient is pushing at this time. She states she is getting tired.  Objective: BP 123/37  Pulse 119  Temp(Src) 99.5 F (37.5 C) (Oral)  Resp 20  Ht 5\' 6"  (1.676 m)  Wt 73.483 kg (162 lb)  BMI 26.16 kg/m2  SpO2 100%  LMP 12/26/2012 I/O last 3 completed shifts: In: -  Out: 1350 [Urine:1350] Total I/O In: -  Out: 450 [Urine:450]  FHT:  FHR: 155 bpm, variability: moderate,  accelerations:  Abscent,  decelerations:  Present early UC:   regular, every 2 minutes SVE:   Dilation: 10 Effacement (%): 100 Station: +2 Exam by:: leftwich-kirby, cnm  Labs: Lab Results  Component Value Date   WBC 9.3 10/13/2013   HGB 11.1* 10/13/2013   HCT 32.6* 10/13/2013   MCV 87.4 10/13/2013   PLT 294 10/13/2013    Assessment / Plan: Spontaneous labor, progressing normally  Labor: Progressing normally Preeclampsia:  n/a Fetal Wellbeing:  Category I Pain Control:  Epidural I/D:  n/a Anticipated MOD:  NSVD  Leone Haven 10/14/2013, 9:38 PM

## 2013-10-14 NOTE — Progress Notes (Signed)
Brenda Washington is a 28 y.o. G1P0 at [redacted]w[redacted]d by ultrasound admitted for active labor with GHTN.  Subjective:  Pt is doing well with epidural. Family in room. Desires medication for nausea.  Communication done through Patent attorney.  Objective: BP 126/98  Pulse 81  Temp(Src) 98.8 F (37.1 C) (Oral)  Resp 20  Ht 5\' 6"  (1.676 m)  Wt 73.483 kg (162 lb)  BMI 26.16 kg/m2  SpO2 100%  LMP 12/26/2012 I/O last 3 completed shifts: In: -  Out: 250 [Urine:250]    FHT:  FHR: 135 bpm, variability: moderate,  accelerations:  Present,  decelerations: Absent UC:   irregular, every 4.5-9 minutes SVE:   Dilation: 7 Effacement (%): 70 Station: -3 Exam by:: muhammed, cnm  Labs: Lab Results  Component Value Date   WBC 9.3 10/13/2013   HGB 11.1* 10/13/2013   HCT 32.6* 10/13/2013   MCV 87.4 10/13/2013   PLT 294 10/13/2013    Assessment / Plan: Spontaneous labor, progressing normally  Labor: Progressing normally Preeclampsia:  N/A Fetal Wellbeing:  Category I Pain Control:  Epidural and Fentanyl I/D:  n/a Anticipated MOD:  NSVD  Melvern Banker 10/14/2013, 9:38 AM

## 2013-10-14 NOTE — Progress Notes (Signed)
Brenda Washington is a 28 y.o. G1P0 at [redacted]w[redacted]d by ultrasound admitted for active labor  Subjective: Patient has been pushing for over three hours and she reports feeling tired. Patient is Spanish-speaking only, Spanish interpreter present for this encounter.   Objective: BP 158/73  Pulse 119  Temp(Src) 98.7 F (37.1 C) (Oral)  Resp 20  Ht 5\' 6"  (1.676 m)  Wt 162 lb (73.483 kg)  BMI 26.16 kg/m2  SpO2 100%  LMP 12/26/2012 I/O last 3 completed shifts: In: -  Out: 1350 [Urine:1350] Total I/O In: -  Out: 450 [Urine:450]  FHT:  FHR: 150 bpm, variability: moderate,  accelerations:  Abscent,  decelerations:  Present early UC:   regular, every 3 minutes SVE:   Dilation: 10 Effacement (%): 100 Station: +3 Pt pushing well  Labs: Lab Results  Component Value Date   WBC 9.3 10/13/2013   HGB 11.1* 10/13/2013   HCT 32.6* 10/13/2013   MCV 87.4 10/13/2013   PLT 294 10/13/2013    Assessment / Plan: Prolonged second stage of labor.  Category I tracing. Patient was given options of continuing to push vs vacuum assistance vs cesarean delivery. Risks/benefits reviewed in detail. Patient opted for vacuum assistance, will proceed with this soon.  Osborne Oman, MD 10/14/2013, 10:21 PM

## 2013-10-14 NOTE — Progress Notes (Signed)
Jaquaya Alfonso-Ramos is a 28 y.o. G1P0 at [redacted]w[redacted]d by ultrasound admitted for active labor  Subjective: Pt comfortable with epidural, feeling intermittent mild vaginal pressure.  Family in room for support.   Objective: BP 122/66  Pulse 98  Temp(Src) 99.8 F (37.7 C) (Oral)  Resp 20  Ht 5\' 6"  (1.676 m)  Wt 73.483 kg (162 lb)  BMI 26.16 kg/m2  SpO2 100%  LMP 12/26/2012 I/O last 3 completed shifts: In: -  Out: 250 [Urine:250] Total I/O In: -  Out: 1100 [Urine:1100]  FHT:  FHR: 150 bpm, variability: moderate,  accelerations:  Abscent,  decelerations:  Present early UC:   regular, every 2-4 minutes SVE:   Dilation: Lip/rim Effacement (%): 100 Station: +1 Exam by:: k fields, rn  Labs: Lab Results  Component Value Date   WBC 9.3 10/13/2013   HGB 11.1* 10/13/2013   HCT 32.6* 10/13/2013   MCV 87.4 10/13/2013   PLT 294 10/13/2013    Assessment / Plan: Augmentation of labor, progressing well  Labor: Progressing normally Preeclampsia:  n/a Fetal Wellbeing:  Category I Pain Control:  Epidural I/D:  n/a Anticipated MOD:  NSVD  Rebeckah Masih A Leftwich-Kirby 10/14/2013, 6:09 PM

## 2013-10-14 NOTE — H&P (Signed)
Attestation of Attending Supervision of Advanced Practitioner (CNM/NP): Evaluation and management procedures were performed by the Advanced Practitioner under my supervision and collaboration.  I have reviewed the Advanced Practitioner's note and chart, and I agree with the management and plan.  Aidynn Polendo Harraway-Smith 7:07 AM

## 2013-10-14 NOTE — Progress Notes (Signed)
   Subjective: Reports comfortable with epidural.  No questions.   Objective: BP 115/73  Pulse 82  Temp(Src) 98.8 F (37.1 C) (Oral)  Resp 20  Ht 5\' 6"  (1.676 m)  Wt 73.483 kg (162 lb)  BMI 26.16 kg/m2  SpO2 100%  LMP 12/26/2012 I/O last 3 completed shifts: In: -  Out: 250 [Urine:250]    FHT:  FHR: 130's bpm, variability: moderate,  accelerations:  Present,  decelerations:  Absent UC:   regular, every 3-4 minutes SVE:   Dilation: 6-7 Effacement (%): 70 Station: -3 Exam by:: Loa Socks, CNM  Labs: Lab Results  Component Value Date   WBC 9.3 10/13/2013   HGB 11.1* 10/13/2013   HCT 32.6* 10/13/2013   MCV 87.4 10/13/2013   PLT 294 10/13/2013    Assessment / Plan: Augmentation of labor, progressing well  Labor: Progressing normally Preeclampsia: n/a Fetal Wellbeing:  Category I Pain Control:  Epidural I/D:  GBS neg Anticipated MOD:  NSVD  Joelyn Oms 10/14/2013, 7:51 AM

## 2013-10-14 NOTE — Progress Notes (Signed)
Dr. Harolyn Rutherford given update regarding maternal pushing (2 hr 40 min) with progress and fetal status (category II FHR tracing); variables with contractions with return to baseline.

## 2013-10-14 NOTE — Progress Notes (Signed)
Brenda Washington is a 28 y.o. G1P0 at [redacted]w[redacted]d admitted for active labor  Subjective: Pt comfortable with epidural.  Family in room for support.  Objective: BP 123/74  Pulse 83  Temp(Src) 99.5 F (37.5 C) (Axillary)  Resp 20  Ht 5\' 6"  (1.676 m)  Wt 73.483 kg (162 lb)  BMI 26.16 kg/m2  SpO2 100%  LMP 12/26/2012 I/O last 3 completed shifts: In: -  Out: 250 [Urine:250]    FHT:  FHR: 135 bpm, variability: moderate,  accelerations:  Present,  decelerations:  Absent UC:   irregular, every 2-6 minutes SVE:   Dilation: 6 Effacement (%): 70 Station: -2 Exam by:: leftwich kirby, cnm   Labs: Lab Results  Component Value Date   WBC 9.3 10/13/2013   HGB 11.1* 10/13/2013   HCT 32.6* 10/13/2013   MCV 87.4 10/13/2013   PLT 294 10/13/2013    Assessment / Plan: Protracted active phase  Labor: Start Pitocin augmentation Preeclampsia:  n/a Fetal Wellbeing:  Category I Pain Control:  Epidural I/D:  n/a Anticipated MOD:  NSVD  Brenda Washington A Leftwich-Kirby 10/14/2013, 11:22 AM

## 2013-10-14 NOTE — Anesthesia Preprocedure Evaluation (Signed)
Anesthesia Evaluation Anesthesia Physical Anesthesia Plan  ASA: II  Anesthesia Plan: Epidural   Post-op Pain Management:    Induction:   Airway Management Planned:   Additional Equipment:   Intra-op Plan:   Post-operative Plan:   Informed Consent: I have reviewed the patients History and Physical, chart, labs and discussed the procedure including the risks, benefits and alternatives for the proposed anesthesia with the patient or authorized representative who has indicated his/her understanding and acceptance.   Dental Advisory Given  Plan Discussed with:   Anesthesia Plan Comments: (Labs checked- platelets confirmed with RN in room. Fetal heart tracing, per RN, reported to be stable enough for sitting procedure. Discussed epidural, and patient consents to the procedure:  included risk of possible headache,backache, failed block, allergic reaction, and nerve injury. This patient was asked if she had any questions or concerns before the procedure started.)        Anesthesia Quick Evaluation  

## 2013-10-14 NOTE — Anesthesia Procedure Notes (Signed)

## 2013-10-14 NOTE — Progress Notes (Signed)
   Subjective: Reports increased pain with contractions.  No longer has a headache.  Objective: BP 126/70  Pulse 77  Temp(Src) 98.9 F (37.2 C) (Oral)  Resp 18  Ht 5\' 6"  (1.676 m)  Wt 73.483 kg (162 lb)  BMI 26.16 kg/m2  LMP 12/26/2012      FHT:  FHR: 130's bpm, variability: moderate,  accelerations:  Present,  decelerations:  Absent UC:   regular, every 1.5-3 minutes SVE:   Dilation: 5 Effacement (%): 70 Station: -3 Exam by:: Loa Socks, CNM;  Foley bulb out of cervix in vagina.   Labs: Lab Results  Component Value Date   WBC 9.3 10/13/2013   HGB 11.1* 10/13/2013   HCT 32.6* 10/13/2013   MCV 87.4 10/13/2013   PLT 294 10/13/2013    Assessment / Plan: 28 yo G1P0 at [redacted]w[redacted]d wks IUP Gestational Hypertension Induction of Labor - progressing well  Labor: Progressing normally Preeclampsia:  Asymptomatic Fetal Wellbeing:  Category I Pain Control:  Epidural ordered I/D:  GBS neg Anticipated MOD:  NSVD  Joelyn Oms 10/14/2013, 4:20 AM

## 2013-10-14 NOTE — Progress Notes (Signed)
I have seen this patient and agree with the above PA student's note.  LEFTWICH-KIRBY, [place] Certified Nurse-Midwife

## 2013-10-15 ENCOUNTER — Encounter (HOSPITAL_COMMUNITY): Payer: Self-pay

## 2013-10-15 MED ORDER — MEASLES, MUMPS & RUBELLA VAC ~~LOC~~ INJ
0.5000 mL | INJECTION | Freq: Once | SUBCUTANEOUS | Status: DC
  Filled 2013-10-15: qty 0.5

## 2013-10-15 MED ORDER — SIMETHICONE 80 MG PO CHEW: 80.0000 mg | CHEWABLE_TABLET | ORAL | Status: DC | PRN

## 2013-10-15 MED ORDER — SENNOSIDES-DOCUSATE SODIUM 8.6-50 MG PO TABS
2.0000 | ORAL_TABLET | ORAL | Status: DC
  Administered 2013-10-15 (×2): 2 via ORAL
  Filled 2013-10-15 (×2): qty 2

## 2013-10-15 MED ORDER — OXYCODONE-ACETAMINOPHEN 5-325 MG PO TABS
1.0000 | ORAL_TABLET | ORAL | Status: DC | PRN
  Administered 2013-10-15: 1 via ORAL
  Filled 2013-10-15: qty 1

## 2013-10-15 MED ORDER — DIBUCAINE 1 % RE OINT: 1.0000 "application " | TOPICAL_OINTMENT | RECTAL | Status: DC | PRN

## 2013-10-15 MED ORDER — IBUPROFEN 600 MG PO TABS
600.0000 mg | ORAL_TABLET | Freq: Four times a day (QID) | ORAL | Status: DC
  Administered 2013-10-15 – 2013-10-16 (×6): 600 mg via ORAL
  Filled 2013-10-15 (×6): qty 1

## 2013-10-15 MED ORDER — ONDANSETRON HCL 4 MG/2ML IJ SOLN: 4.0000 mg | INTRAMUSCULAR | Status: DC | PRN

## 2013-10-15 MED ORDER — TETANUS-DIPHTH-ACELL PERTUSSIS 5-2.5-18.5 LF-MCG/0.5 IM SUSP: 0.5000 mL | Freq: Once | INTRAMUSCULAR | Status: DC

## 2013-10-15 MED ORDER — ZOLPIDEM TARTRATE 5 MG PO TABS: 5.0000 mg | ORAL_TABLET | Freq: Every evening | ORAL | Status: DC | PRN

## 2013-10-15 MED ORDER — LANOLIN HYDROUS EX OINT: TOPICAL_OINTMENT | CUTANEOUS | Status: DC | PRN

## 2013-10-15 MED ORDER — ONDANSETRON HCL 4 MG PO TABS: 4.0000 mg | ORAL_TABLET | ORAL | Status: DC | PRN

## 2013-10-15 MED ORDER — WITCH HAZEL-GLYCERIN EX PADS: 1.0000 "application " | MEDICATED_PAD | CUTANEOUS | Status: DC | PRN

## 2013-10-15 MED ORDER — RHO D IMMUNE GLOBULIN 1500 UNIT/2ML IJ SOSY
300.0000 ug | PREFILLED_SYRINGE | Freq: Once | INTRAMUSCULAR | Status: AC
  Administered 2013-10-15: 300 ug via INTRAMUSCULAR
  Filled 2013-10-15: qty 2

## 2013-10-15 MED ORDER — BENZOCAINE-MENTHOL 20-0.5 % EX AERO
1.0000 "application " | INHALATION_SPRAY | CUTANEOUS | Status: DC | PRN
  Administered 2013-10-15: 1 via TOPICAL
  Filled 2013-10-15: qty 56

## 2013-10-15 MED ORDER — DIPHENHYDRAMINE HCL 25 MG PO CAPS: 25.0000 mg | ORAL_CAPSULE | Freq: Four times a day (QID) | ORAL | Status: DC | PRN

## 2013-10-15 NOTE — Progress Notes (Signed)
Post Partum Day 1 Subjective: no complaints, up ad lib, voiding, tolerating PO and + flatus  Objective: Blood pressure 115/76, pulse 105, temperature 97.7 F (36.5 C), temperature source Oral, resp. rate 20, height 5\' 6"  (1.676 m), weight 73.483 kg (162 lb), last menstrual period 12/26/2012, SpO2 100.00%, unknown if currently breastfeeding.  Physical Exam:  General: alert, cooperative and no distress Lochia: appropriate Uterine Fundus: firm Incision: N/A DVT Evaluation: No evidence of DVT seen on physical exam. Negative Homan's sign. No cords or calf tenderness. No significant calf/ankle edema.   Recent Labs  10/13/13 1950  HGB 11.1*  HCT 32.6*    Assessment/Plan: Plan for discharge tomorrow  Mom Rh (-) Need to figure out status of baby   LOS: 2 days   Melvern Banker 10/15/2013, 8:31 AM    I spoke with and examined patient and agree with PA-S's note and plan of care.  Fredrik Rigger, MD Ob Fellow 10/15/2013 1:19 PM

## 2013-10-15 NOTE — Anesthesia Postprocedure Evaluation (Signed)
Anesthesia Post Note  Patient: Brenda Washington  Procedure(s) Performed: * No procedures listed *  Anesthesia type: Epidural  Patient location: Mother/Baby  Post pain: Pain level controlled  Post assessment: Post-op Vital signs reviewed  Last Vitals:  Filed Vitals:   10/15/13 0600  BP: 115/76  Pulse: 105  Temp: 36.5 C  Resp: 20    Post vital signs: Reviewed  Level of consciousness:alert  Complications: No apparent anesthesia complications

## 2013-10-15 NOTE — MAU Provider Note (Signed)
Entered in error

## 2013-10-15 NOTE — Progress Notes (Signed)
UR chart review completed.  

## 2013-10-16 LAB — TYPE AND SCREEN
ABO/RH(D): O NEG
Antibody Screen: POSITIVE
DAT, IGG: NEGATIVE
UNIT DIVISION: 0
Unit division: 0

## 2013-10-16 LAB — RH IG WORKUP (INCLUDES ABO/RH)
ABO/RH(D): O NEG
Fetal Screen: NEGATIVE
Gestational Age(Wks): 40.3
Unit division: 0

## 2013-10-16 MED ORDER — IBUPROFEN 600 MG PO TABS: 600.0000 mg | ORAL_TABLET | Freq: Four times a day (QID) | ORAL | Status: DC

## 2013-10-16 NOTE — Discharge Instructions (Signed)

## 2013-10-16 NOTE — Lactation Note (Signed)
This note was copied from the chart of Brenda Donyetta Washington. Lactation Consultation Note Baby jaundice, on the breast frequently, not sucking properly, chewing instead. Has very high palate. Good range of motion with tongue, but arches tongue instead of cupping nipple. Starting Double Photo Therapy d/t jaundice. Lips moist, tongue appears a little dry. Mom requesting formula, RN gave formula w/slow flow nipple, baby doesn't suck on nipple, chews and gags. Moms has bouncy areolas w/blister to Rt. Nipple. Filling w/colostrum. Hand expression taught, and to rub colostrum to nipples for soreness. Comfort gels given. Wanted to see how the baby was latching, mom stated that she was hurting to bad at this time, to tired and her nipples needed a break. With the baby chewing and not sucking properly, I can understand why she is so sore. Encouraged mom to hand express colostrum to keep breast stimulated since she's to sore to put baby to breast and didn't want to pump at this time. Hand pump given.  Patient Name: Brenda Washington Mom encouraged to feed baby w/feeding cuesEncouraged to call for assistance if needed and to verify proper latch.Hand expression taught to Mom. educated about newborn behavior and suck training. Today's Date: 10/16/2013     Maternal Data    Feeding Feeding Type: Breast Fed Length of feed: 30 min  [facility] Score/Interventions                      Lactation Tools Discussed/Used     Consult Status      Theodoro Kalata 10/16/2013, 4:04 AM

## 2013-10-16 NOTE — Discharge Summary (Signed)
Obstetric Discharge Summary Reason for Admission: due to postdates Prenatal Procedures: Rho gam at 28 weeks Intrapartum Procedures: vacuum Postpartum Procedures: Rho(D) Ig Complications-Operative and Postpartum: 2nd degree perineal laceration Hemoglobin  Date Value Ref Range Status  10/13/2013 11.1* 12.0 - 15.0 g/dL Final     HCT  Date Value Ref Range Status  10/13/2013 32.6* 36.0 - 46.0 % Final    Discharge Diagnoses: Post-date pregnancy  Hospital Course:  Brenda Washington is a 28 y.o. G1P1001 who presented with post dates.  She had a uncomplicated vacuum assisted vaginal delivery. She was able to ambulate, tolerate PO and void normally. She was discharged home with instructions for postpartum care. Pt Rh(-) given Rhogam postpartum.    Operative Delivery Note  At 10:46 PM a viable female was delivered via Vaginal, Vacuum Neurosurgeon). Presentation: vertex; Position: Right,, Occiput,, Anterior; Station: +3.  Verbal consent: obtained from patient using help of Spanish interpreter. Risks and benefits discussed in detail. Risks include, but are not limited to the risks of anesthesia, bleeding, infection, damage to maternal tissues, fetal cephalhematoma. There is also the risk of inability to effect vaginal delivery of the head, or shoulder dystocia that cannot be resolved by established maneuvers, leading to the need for emergency cesarean section.  APGAR: 8, 9; weight .  Placenta status: Intact, Spontaneous.  Cord: 3 vessels with the following complications: None.  Anesthesia: Epidural  Lacerations: 2nd degree  Suture Repair: 3.0 vicryl  Est. Blood Loss (mL): 400 ml  Mom to postpartum. Baby to Couplet care / Skin to Skin. Mom plans to breastfeed, Nexplanon for contraception. Does not desire circumcision for her female infant.   Verita Schneiders, MD, [place]  Attending [place], [place]    Physical Exam:  General: alert,  cooperative and no distress Lochia: appropriate Uterine Fundus: firm DVT Evaluation: No evidence of DVT seen on physical exam. Negative Homan's sign. No cords or calf tenderness. No significant calf/ankle edema.  Discharge Information: Date: 10/16/2013 Activity: pelvic rest Diet: routine Medications: motrin and prenatal vitamin Baby feeding: plans to breastfeed, plans to bottle feed Contraception: nexplanon Condition: stable Instructions: refer to practice specific booklet Discharge to: home   Newborn Data: Live born female  Birth Weight: 7 lb 11.2 oz (3493 g) APGAR: 8, 9  Baby undergoing phototherapy for hyperbilirubinemia for the next 24 hours (started at 5 am 10/16/13). Home with mother after cleared by peds  Melvern Banker PA-S2 10/16/2013, 9:24 AM  I have seen and examined this patient and agree with above documentation in the PA student's note.   Ebbie Latus, M.D. [facility] Fellow 10/16/2013 9:59 AM

## 2013-10-17 ENCOUNTER — Ambulatory Visit: Payer: Self-pay

## 2013-10-17 NOTE — MAU Provider Note (Signed)
Entered in Error

## 2013-10-17 NOTE — Lactation Note (Signed)
This note was copied from the chart of Brenda Gayla Alfonso-Ramos. Lactation Consultation Note Mom stated baby was feeding better and was learning to suck well now. Breast and bottle at this time. Double photo therapy cont. Denied any LC assistance at this time. Patient Name: Brenda Washington Date: 10/17/2013     Maternal Data    Feeding Feeding Type: Breast Fed Length of feed: 20 min  [facility] Score/Interventions                      Lactation Tools Discussed/Used     Consult Status      Theodoro Kalata 10/17/2013, 2:34 AM

## 2013-10-17 NOTE — Lactation Note (Signed)
This note was copied from the chart of Brenda Elene Alfonso-Ramos. Lactation Consultation Note    Follow up consult with this mom and term baby, double phototherapy stopped this morning, bili down to 8.7. Baby and mom now 38 hours post partum. Mom pumped 35 mls of transitional milk this morning. I explained LEAD to mom, encouraging her to avoid formula now that her milk is in, to breast feed only, and pump if full or need to supplement. Hand pump reviewed with mom, and mom encouraged to call [place]  To add baby. Mom latched baby well with cross cradle, obtained a deeper latch, which was very comfortable for mom. Mom knows to call for questions/concerns.  Patient Name: Brenda Washington [id number] Date: 10/17/2013 Reason for consult: Follow-up assessment   Maternal Data    Feeding Feeding Type: Breast Fed Length of feed: 15 min  LATCH Score/Interventions Latch: Grasps breast easily, tongue down, lips flanged, rhythmical sucking. Intervention(s): Adjust position;Assist with latch;Breast compression  Audible Swallowing: Spontaneous and intermittent  Type of Nipple: Everted at rest and after stimulation  Comfort (Breast/Nipple): Soft / non-tender (nipples tender, no breakdown)     Hold (Positioning): Assistance needed to correctly position infant at breast and maintain latch. Intervention(s): Breastfeeding basics reviewed;Support Pillows;Position options;Skin to skin  LATCH Score: 9  Lactation Tools Discussed/Used Tools: Pump Breast pump type: Manual (mom instructed in use,) WIC Program: Yes (mom advised to call wic to add baby, and set up a time to get DEP for going back to work) Pump Review: Setup, frequency, and cleaning;Milk Storage;Other (comment) (hand expression)   Consult Status Consult Status: Complete Follow-up type: Call as needed    Brenda Washington 10/17/2013, 9:51 AM

## 2014-03-24 ENCOUNTER — Encounter (HOSPITAL_COMMUNITY): Payer: Self-pay

## 2014-04-25 ENCOUNTER — Other Ambulatory Visit: Payer: Self-pay | Admitting: Obstetrics and Gynecology

## 2014-04-25 DIAGNOSIS — N63 Unspecified lump in unspecified breast: Secondary | ICD-10-CM

## 2014-04-30 ENCOUNTER — Other Ambulatory Visit: Payer: Self-pay | Admitting: Obstetrics and Gynecology

## 2014-04-30 ENCOUNTER — Other Ambulatory Visit: Payer: Self-pay

## 2014-04-30 DIAGNOSIS — N63 Unspecified lump in unspecified breast: Secondary | ICD-10-CM

## 2014-05-02 ENCOUNTER — Ambulatory Visit
Admission: RE | Admit: 2014-05-02 | Discharge: 2014-05-02 | Disposition: A | Discharge status: Home / Self Care | Payer: No Typology Code available for payment source | Source: Ambulatory Visit | Attending: Obstetrics and Gynecology | Admitting: Obstetrics and Gynecology

## 2014-05-02 ENCOUNTER — Other Ambulatory Visit: Payer: Self-pay | Admitting: Obstetrics and Gynecology

## 2014-05-02 DIAGNOSIS — N63 Unspecified lump in unspecified breast: Secondary | ICD-10-CM

## 2014-05-14 ENCOUNTER — Other Ambulatory Visit: Payer: Self-pay | Admitting: Obstetrics and Gynecology

## 2014-05-14 DIAGNOSIS — N63 Unspecified lump in unspecified breast: Secondary | ICD-10-CM

## 2014-05-19 ENCOUNTER — Inpatient Hospital Stay: Admission: RE | Admit: 2014-05-19 | Payer: Self-pay | Source: Ambulatory Visit

## 2014-05-28 ENCOUNTER — Ambulatory Visit
Admission: RE | Admit: 2014-05-28 | Discharge: 2014-05-28 | Disposition: A | Discharge status: Home / Self Care | Payer: Self-pay | Source: Ambulatory Visit | Attending: Obstetrics and Gynecology | Admitting: Obstetrics and Gynecology

## 2014-05-28 DIAGNOSIS — N63 Unspecified lump in unspecified breast: Secondary | ICD-10-CM

## 2014-11-11 IMAGING — US US BREAST LTD UNI LEFT INC AXILLA
1 series · 5 of 5 positions shown · non-contrast
Comparison: none

[Series 2: us breast ltd uni left inc axilla · 5 of 5 slices shown]
[im 1/5]
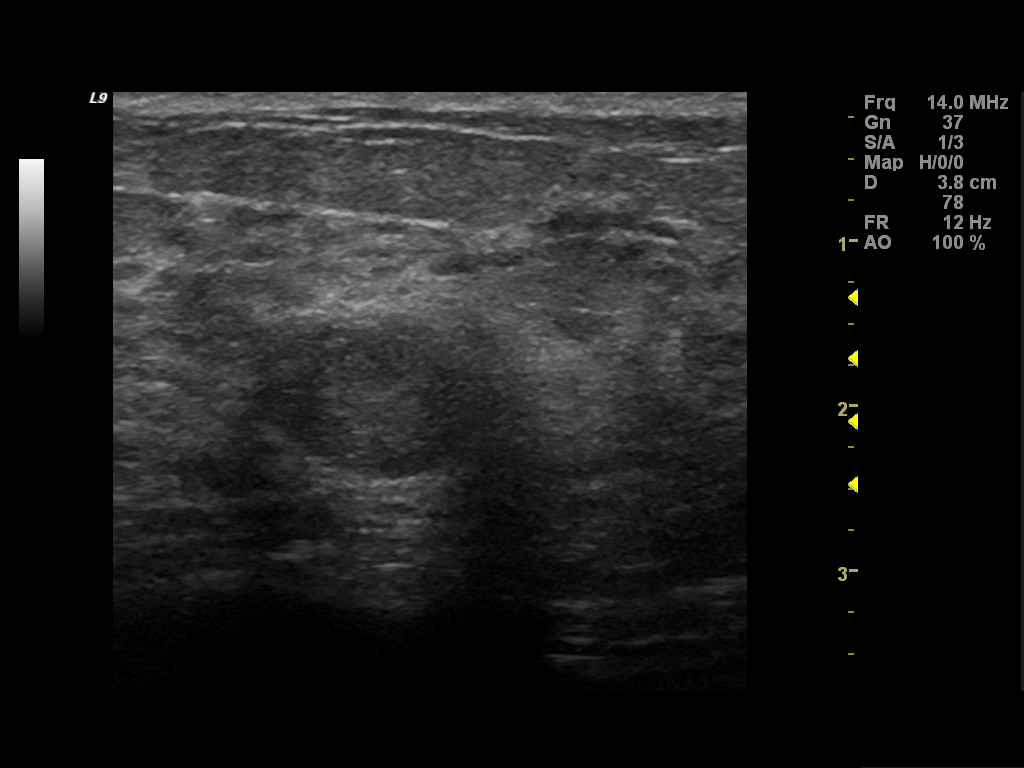
[im 2/5]
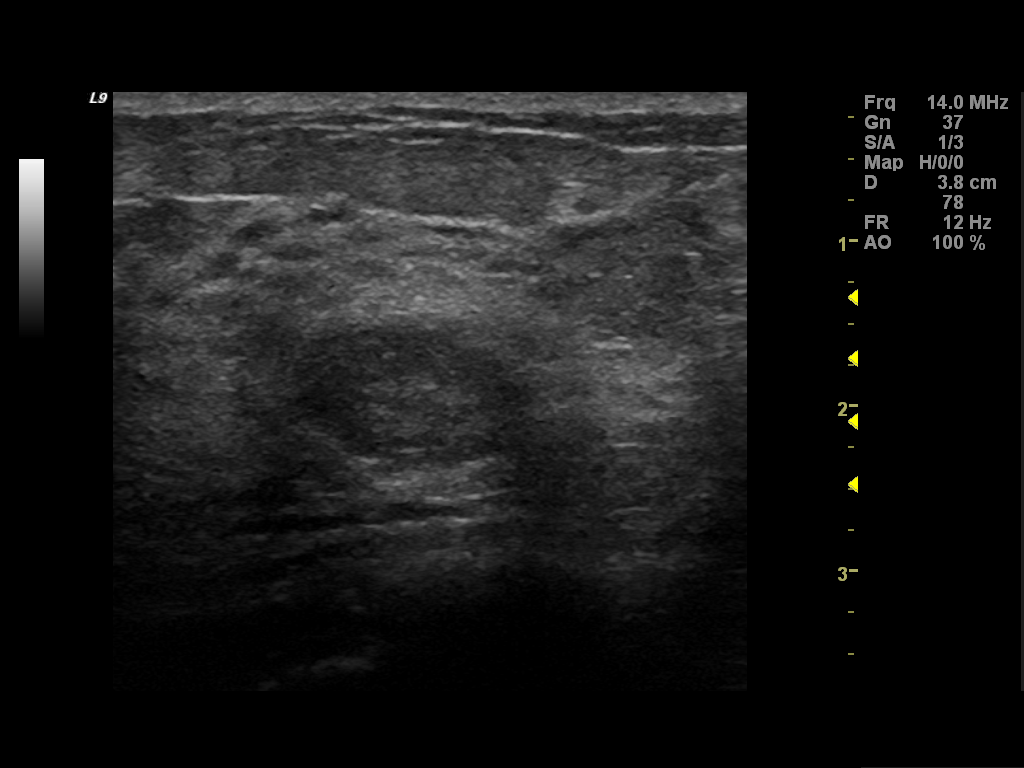
[im 3/5]
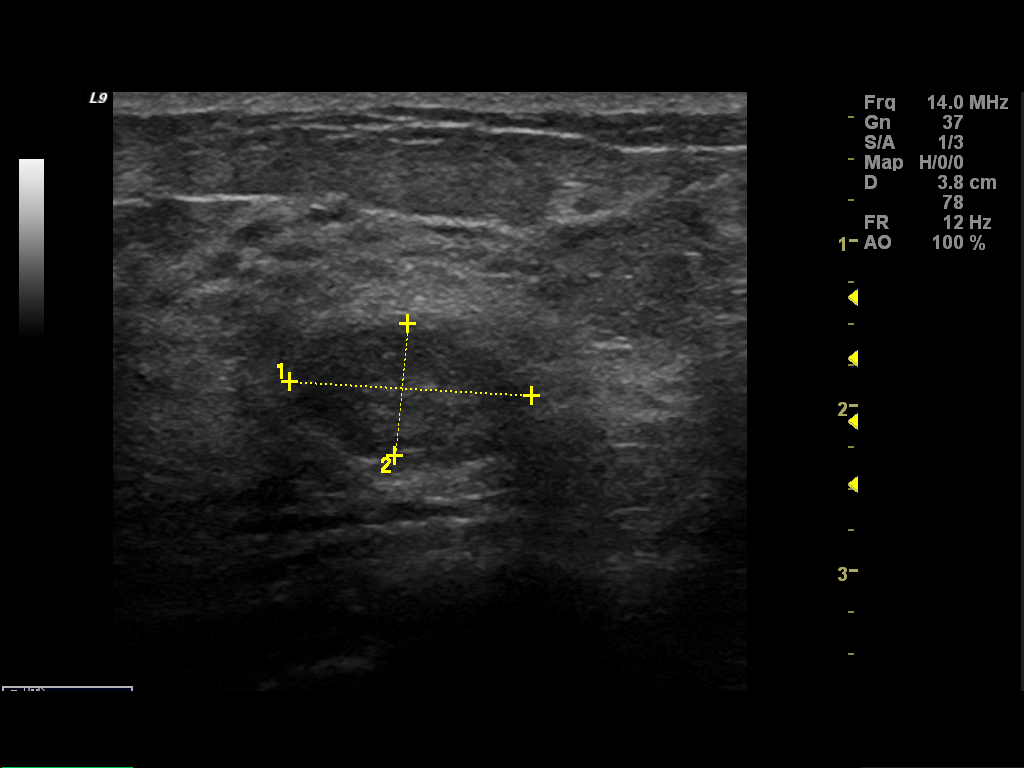
[im 4/5]
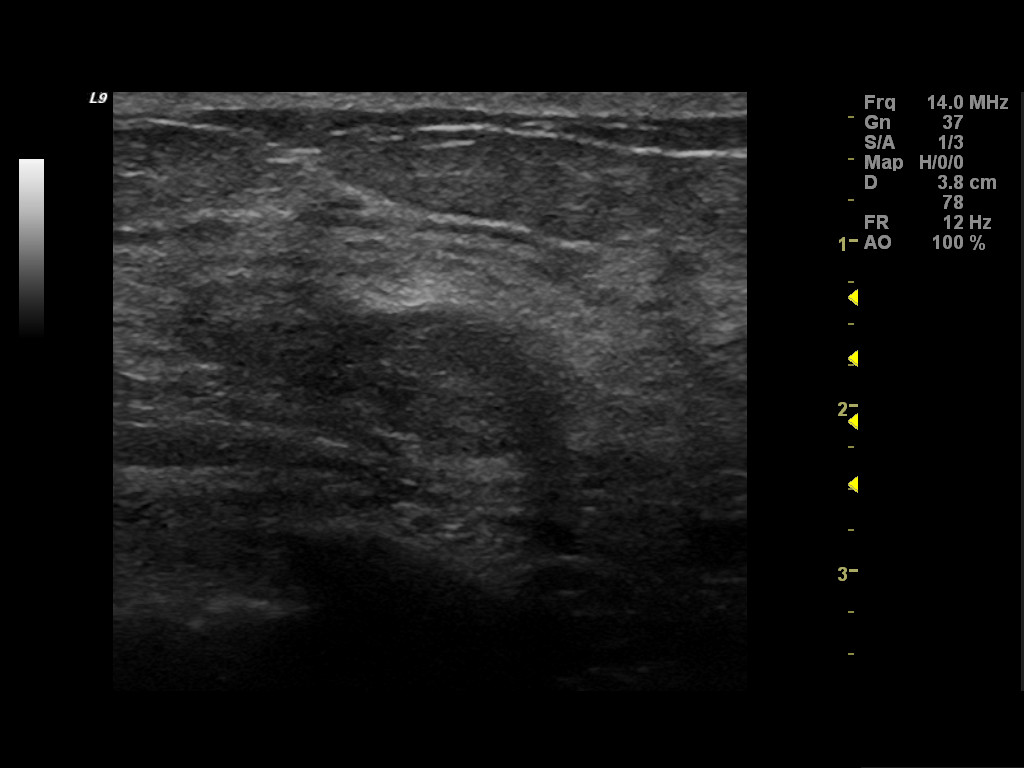
[im 5/5]
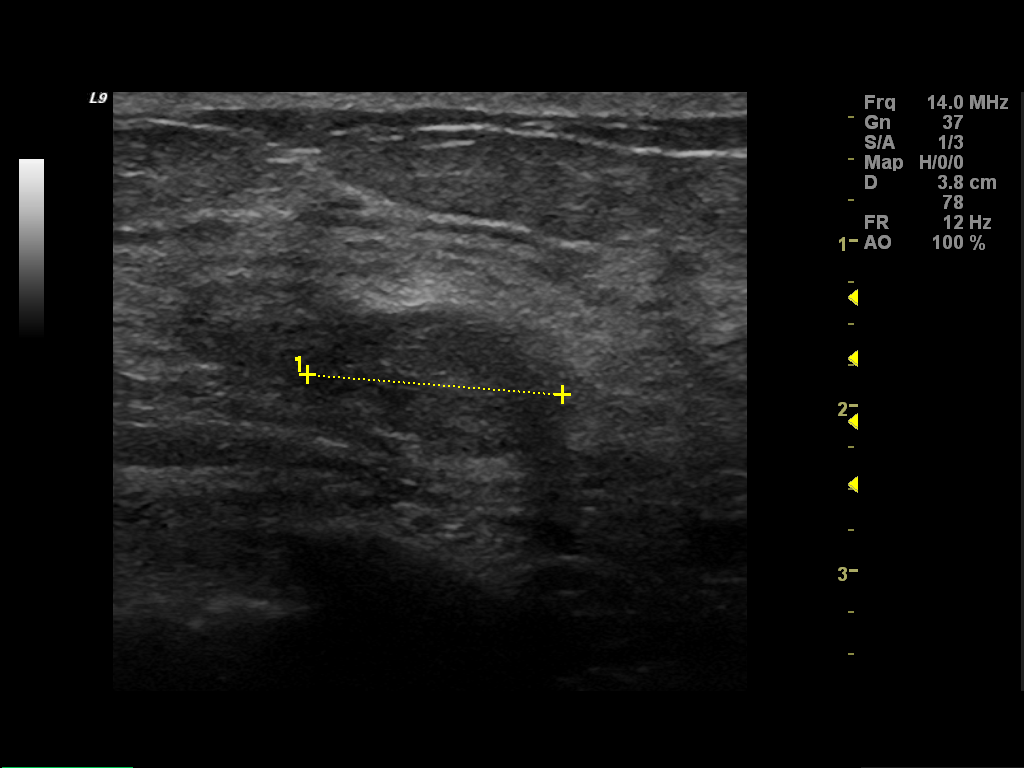

[5 of 5 positions shown; findings below may reference images not displayed]

CLINICAL DATA
Patient is 27 weeks pregnant. She is here for follow-up of bilateral
breast masses. These were initially evaluated in 3666. The patient
did not obtain interval follow-up.

EXAM
ULTRASOUND OF THE BILATERAL BREAST

COMPARISON
08/04/2009

FINDINGS
On physical exam,a mobile mass is palpated in the right breast at 10
o'clock in the axillary tail and at 930, 9 cm from the nipple. In
the left breast, a mobile mass is palpated at 9 o'clock, 2 cm from
the nipple..

Targeted ultrasound of the right breast demonstrates a 2.3 x 2.1 x
1.1 cm hypoechoic circumscribed mass at 10 o'clock in the axillary
tail. This is without significant change from the prior examination
in which it measured 2.4 x 2 x 1.2 cm. At 930, 9 cm from the nipple,
in the right breast, there is a 3.2 x 4.3 x 1.9 cm circumscribed
hypoechoic mass. This has increased in size since the 3666
examination in which it measured 2.1 x 2.1 x 0.9 cm. In the left
breast, there is a 1.5 x 1.6 x 0.8 cm mass at 9 o'clock, 2 cm from
the nipple. This has slightly increased in size since the 3666
examination in which it measured 1.2 x 1.2 x 0.6 cm.

IMPRESSION
1. Significant increase in size of the right breast mass at 930
since 3666. This continues to have sonographic features of a
fibroadenoma.
2. Slight increase in size in the left breast mass since 3666. This
likely represents a fibroadenoma.
3. No change in the right breast mass at 10 o'clock since 3666,
indicating benignity.

RECOMMENDATION
Given that the right breast mass at 930 and the left breast mass
have sonographic features of fibroadenomas, these likely represent
fibroadenomas which have increased in size given the hormonal
changes of pregnancy. Recommend follow-up ultrasound in 6 months to
reassess in the postpartum state.

I have discussed the findings and recommendations with the patient.
Results were also provided in writing at the conclusion of the
visit. If applicable, a reminder letter will be sent to the patient
regarding the next appointment.

BI-RADS CATEGORY
3: Probably benign finding(s) - short interval follow-up suggested.

SIGNATURE

## 2017-05-09 ENCOUNTER — Encounter (HOSPITAL_COMMUNITY): Payer: Self-pay

## 2021-06-15 ENCOUNTER — Other Ambulatory Visit: Payer: Self-pay

## 2021-06-15 ENCOUNTER — Ambulatory Visit (HOSPITAL_COMMUNITY)
Admission: EM | Admit: 2021-06-15 | Discharge: 2021-06-15 | Disposition: A | Discharge status: Home / Self Care | Payer: Self-pay | Attending: Emergency Medicine | Admitting: Emergency Medicine

## 2021-06-15 ENCOUNTER — Encounter (HOSPITAL_COMMUNITY): Payer: Self-pay

## 2021-06-15 DIAGNOSIS — R509 Fever, unspecified: Secondary | ICD-10-CM

## 2021-06-15 DIAGNOSIS — Z20822 Contact with and (suspected) exposure to covid-19: Secondary | ICD-10-CM

## 2021-06-15 LAB — POCT INFECTIOUS MONO SCREEN, ED / UC: Mono Screen: NEGATIVE

## 2021-06-15 LAB — POC INFLUENZA A AND B ANTIGEN (URGENT CARE ONLY)
INFLUENZA A ANTIGEN, POC: NEGATIVE
INFLUENZA B ANTIGEN, POC: NEGATIVE

## 2021-06-15 LAB — POCT RAPID STREP A, ED / UC: Streptococcus, Group A Screen (Direct): NEGATIVE

## 2021-06-15 MED ORDER — IBUPROFEN 600 MG PO TABS: 600.0000 mg | ORAL_TABLET | Freq: Four times a day (QID) | ORAL | 0 refills | Status: AC | PRN

## 2021-06-15 MED ORDER — ACETAMINOPHEN 325 MG PO TABS
ORAL_TABLET | ORAL | Status: AC
  Filled 2021-06-15: qty 2

## 2021-06-15 MED ORDER — FLUTICASONE PROPIONATE 50 MCG/ACT NA SUSP: 2.0000 | Freq: Every day | NASAL | 0 refills | Status: AC

## 2021-06-15 MED ORDER — ACETAMINOPHEN 325 MG PO TABS
650.0000 mg | ORAL_TABLET | Freq: Once | ORAL | Status: AC
  Administered 2021-06-15: 17:00:00 650 mg via ORAL

## 2021-06-15 NOTE — ED Triage Notes (Signed)
Pt reports sore throat, body aches, fever and chills x 1 week.  Pt states 1 week she had a right side headache and vision loss in right eye x 1 hr.  a headache. Pt denies vision lost at this time.

## 2021-06-15 NOTE — Discharge Instructions (Addendum)
Your flu, strep and mono are negative.  We will contact you and call in the appropriate antibiotics if your culture comes back positive for an infection requiring antibiotic treatment.  We will contact you if your COVID is positive, and will consider antiviral treatment.  Give [place] a working phone number.  1 gram of Tylenol and 600 mg ibuprofen together 3-4 times a day as needed for pain.  Make sure you drink plenty of extra fluids.  Some people find salt water gargles and  Traditional Medicinal's "Throat Coat" tea helpful. Take 5 mL of liquid Benadryl and 5 mL of Maalox. Mix it together, and then hold it in your mouth for as long as you can and then swallow. You may do this 4 times a day.  Flonase, saline nasal irrigation with a Milta Deiters Med rinse and distilled water as often as you want, Mucinex D for the nasal congestion.  Below is a list of primary care practices who are taking new patients for you to follow-up with.  [facility] -multiple locations.  See website at [email or web address]  [facility] internal medicine clinic Ground Floor - [facility], [place], [place], [place] [address] [phone]  [facility] [address] [place], [place] [address] [phone]  [place], [place] [address] [phone]  [facility] [phone] [place] [address]  [facility]: [place]  [phone]  [facility]: [address] [employer] [place]  [phone]  [place] [facility] : [address] [place] [phone]  [facility]: [address] [place] [phone]  [facility]: [place] [phone]  Dr. Blanchie Serve [place] [address]  [phone]  Go to www.goodrx.com  or www.costplusdrugs.com to look up your medications. This will give you a list of where you can find your prescriptions at the most affordable prices. Or ask the pharmacist what the cash price is, or if they have any other discount programs available to help make your medication more affordable. This can be less expensive than what you would pay with insurance.

## 2021-06-15 NOTE — ED Provider Notes (Addendum)
HPI  SUBJECTIVE:  Brenda Washington is a 36 y.o. female who presents with feeling feverish, body aches, diffuse headache starting last night.  She had similar flulike symptoms with sore throat, cough, body aches, headaches, feeling feverish and nasal congestion 12 days ago, but never sought medical attention.  Everything except the sore throat has resolved.  This is still present.  She states that her flulike symptoms returned last night.  She denies nasal congestion, rhinorrhea, sinus pain or pressure, cough, wheezing, shortness of breath, nausea, vomiting, diarrhea, abdominal pain, trismus, neck stiffness, muffled voice, drooling, sensation of throat swelling shut, difficulty breathing.  No known COVID, flu, strep exposure.  She did not get COVID or flu vaccines.  No antibiotics in the past month.  She took 400 mg of Advil with temporary relief.  No aggravating factors.  No GERD symptoms.  She also reports intermittent headaches since the initial illness.  She states that she had a right sided headache with left visual changes that lasted all day 8 days ago.  States that she was only able to see half of people's faces when her left eye was covered.  She denies vision loss, rather states that it appeared wavy.  She was still able to see objects, light, color without any problems.  Her vision is now normal and that headache has not returned.  She denies slurred speech, arm or leg weakness, discoordination, facial droop during that headache.  She has had similar headaches in the same area with visual changes before, but has never sought medical attention for it.  Patient has no past medical history of migraines, CVA/TIA, atrial fibrillation, hypercoagulability, pulmonary disease, diabetes, hypertension, COVID.  Family history negative for aneurysm, stroke.  LMP: 1 week ago.  Denies the possibility being pregnant.  PMD: None.    Past Medical History:  Diagnosis Date   Fibroadenoma of breast    both     Past Surgical History:  Procedure Laterality Date   NO PAST SURGERIES      Family History  Problem Relation Age of Onset   Alcohol abuse Neg Hx    Arthritis Neg Hx    Asthma Neg Hx    Birth defects Neg Hx    Cancer Neg Hx    COPD Neg Hx    Depression Neg Hx    Diabetes Neg Hx    Drug abuse Neg Hx    Early death Neg Hx    Hearing loss Neg Hx    Heart disease Neg Hx    Hyperlipidemia Neg Hx    Hypertension Neg Hx    Kidney disease Neg Hx    Learning disabilities Neg Hx    Mental illness Neg Hx    Mental retardation Neg Hx    Miscarriages / Stillbirths Neg Hx    Stroke Neg Hx    Vision loss Neg Hx     Social History   Tobacco Use   Smoking status: Never   Smokeless tobacco: Never  Substance Use Topics   Alcohol use: No   Drug use: No    No current facility-administered medications for this encounter.  Current Outpatient Medications:    fluticasone (FLONASE) 50 MCG/ACT nasal spray, Place 2 sprays into both nostrils daily., Disp: 16 g, Rfl: 0   ibuprofen (ADVIL) 600 MG tablet, Take 1 tablet (600 mg total) by mouth every 6 (six) hours as needed., Disp: 30 tablet, Rfl: 0   Multiple Vitamin (MULTIVITAMIN) tablet, Take 1 tablet by mouth daily.,  Disp: , Rfl:    Prenatal Vit-Fe Fumarate-FA (PRENATAL MULTIVITAMIN) TABS tablet, Take 1 tablet by mouth daily. , Disp: , Rfl:   Allergies  Allergen Reactions   Iron Anaphylaxis and Shortness Of Breath    Patient experienced a reaction to the iron supplement prescribed to her.     ROS  As noted in HPI.   Physical Exam  BP (!) 142/92 (BP Location: Right Arm)    Pulse (!) 112    Temp (!) 103 F (39.4 C) (Oral)    Resp 16    LMP  (Within Weeks) Comment: 2 weeks   SpO2 97%    Breastfeeding No   Constitutional: Well developed, well nourished, no acute distress Eyes:  EOMI, conjunctiva normal bilaterally PERRLA, no direct or consensual photophobia.  All visual fields intact to confrontation bilaterally HENT:  Normocephalic, atraumatic,mucus membranes moist.  TMs normal bilaterally.  Positive clear nasal congestion.  Erythematous, swollen turbinates.  No maxillary, frontal sinus tenderness.  Normal tonsils without exudates.  Uvula midline.  Normal voice, no drooling, trismus. Neck: Positive cervical lymphadenopathy, no meningismus. Respiratory: Normal inspiratory effort, lungs clear bilaterally Cardiovascular: Regular tachycardia, no murmurs, rubs, gallops GI: nondistended soft, nontender, splenomegaly skin: No rash, skin intact Musculoskeletal: no deformities Neurologic: Alert & oriented x 3, cranial nerves III through XII intact.  Moving all extremities equally, speech fluent, no facial droop.  Coordination grossly normal. Psychiatric: Speech and behavior appropriate   ED Course   Medications  acetaminophen (TYLENOL) tablet 650 mg (650 mg Oral Given 06/15/21 1726)    Orders Placed This Encounter  Procedures   SARS CORONAVIRUS 2 (TAT 6-24 HRS) Nasopharyngeal Nasopharyngeal Swab    Standing Status:   Standing    Number of Occurrences:   1   POCT Rapid Strep A (ED/UC)    Standing Status:   Standing    Number of Occurrences:   1   POC Influenza A & B Ag (Urgent Care)    Standing Status:   Standing    Number of Occurrences:   1   POCT Infectious Mono Screen    Standing Status:   Standing    Number of Occurrences:   1    Results for orders placed or performed during the hospital encounter of 06/15/21 (from the past 24 hour(s))  POCT Rapid Strep A (ED/UC)     Status: None   Collection Time: 06/15/21  5:30 PM  Result Value Ref Range   Streptococcus, Group A Screen (Direct) NEGATIVE NEGATIVE  SARS CORONAVIRUS 2 (TAT 6-24 HRS) Nasopharyngeal Nasopharyngeal Swab     Status: None   Collection Time: 06/15/21  5:55 PM   Specimen: Nasopharyngeal Swab  Result Value Ref Range   SARS Coronavirus 2 NEGATIVE NEGATIVE  POC Influenza A & B Ag (Urgent Care)     Status: None   Collection Time:  06/15/21  6:11 PM  Result Value Ref Range   INFLUENZA A ANTIGEN, POC NEGATIVE NEGATIVE   INFLUENZA B ANTIGEN, POC NEGATIVE NEGATIVE  POCT Infectious Mono Screen     Status: None   Collection Time: 06/15/21  6:27 PM  Result Value Ref Range   Mono Screen NEGATIVE NEGATIVE   No results found.  ED Clinical Impression  1. Febrile illness   2. Encounter for laboratory testing for COVID-19 virus      ED Assessment/Plan  Patient with acute illness with systemic symptoms of fever, tachycardia.  Patient was given Tylenol while here in the department for fever.  Rapid strep negative.  Sending throat culture to confirm absence of strep throat.  Checking COVID, flu, mono due to duration of sore throat.  If COVID is positive, she will consider Molnupiravir as she is not vaccinated.    It sounds as if patient's right-sided headache with a left visual disturbance is a migraine.  She is currently completely neurologically intact, she has no visual defects on exam.  She has had headaches with similar visual disturbances before.  Advised her to go to the ED if it recurs.  Flu, mono negative. COVID PCR negative.  Home with supportive treatment, Tylenol/ibuprofen, Flonase, Mucinex D, saline nasal irrigation.  Will provide primary care list and order assistance in finding a PMD.  Discussed labs, MDM, treatment plan, and plan for follow-up with patient. Discussed sn/sx that should prompt return to the ED. patient agrees with plan.   Meds ordered this encounter  Medications   acetaminophen (TYLENOL) tablet 650 mg   fluticasone (FLONASE) 50 MCG/ACT nasal spray    Sig: Place 2 sprays into both nostrils daily.    Dispense:  16 g    Refill:  0   ibuprofen (ADVIL) 600 MG tablet    Sig: Take 1 tablet (600 mg total) by mouth every 6 (six) hours as needed.    Dispense:  30 tablet    Refill:  0      *This clinic note was created using Lobbyist. Therefore, there may be occasional  mistakes despite careful proofreading.  ?    Melynda Ripple, MD 06/16/21 2111    Melynda Ripple, MD 06/16/21 [phone]

## 2021-06-16 LAB — SARS CORONAVIRUS 2 (TAT 6-24 HRS): SARS Coronavirus 2: NEGATIVE

## 2022-08-02 ENCOUNTER — Telehealth: Payer: Self-pay

## 2022-08-02 NOTE — Telephone Encounter (Signed)
Telephoned patient using language line interpreter [phone]. Patient stated she needs mammogram due to symptoms. I spoke with Elenora Fender, RN at [facility] Dept, patient needs screening at 36 years of age. She will contact the patient and explain.

## 2022-09-13 ENCOUNTER — Emergency Department (HOSPITAL_COMMUNITY)
Admission: EM | Admit: 2022-09-13 | Discharge: 2022-09-13 | Disposition: A | Discharge status: Home / Self Care | Payer: Self-pay | Attending: Emergency Medicine | Admitting: Emergency Medicine

## 2022-09-13 ENCOUNTER — Other Ambulatory Visit: Payer: Self-pay

## 2022-09-13 DIAGNOSIS — H6690 Otitis media, unspecified, unspecified ear: Secondary | ICD-10-CM

## 2022-09-13 DIAGNOSIS — H6693 Otitis media, unspecified, bilateral: Secondary | ICD-10-CM | POA: Insufficient documentation

## 2022-09-13 DIAGNOSIS — J069 Acute upper respiratory infection, unspecified: Secondary | ICD-10-CM | POA: Insufficient documentation

## 2022-09-13 MED ORDER — AMOXICILLIN 875 MG PO TABS: 875.0000 mg | ORAL_TABLET | Freq: Two times a day (BID) | ORAL | 0 refills | Status: AC

## 2022-09-13 NOTE — ED Triage Notes (Signed)
Pt reports bilateral ear pain, headache, and dizziness. Sates she can hear more out of the L ear than the R. For ten days she's had a sore throat.

## 2022-09-13 NOTE — ED Provider Notes (Signed)
[place] EMERGENCY DEPARTMENT AT [facility] Provider Note   CSN: [id number] Arrival date & time: 09/13/22  1228     History  Chief Complaint  Patient presents with   Ear Pain   Headache    Brenda Washington is a 37 y.o. female.  37 year old female presents with complaint of cough and congestion x 10 days now with left worse than right ear pain, sore throat, headache.  Denies fevers.  No drainage from the ears.  No other complaints or concerns.  Language interpreter used: Spanish.  Headache      Home Medications Prior to Admission medications   Medication Sig Start Date End Date Taking? Authorizing Provider  amoxicillin (AMOXIL) 875 MG tablet Take 1 tablet (875 mg total) by mouth 2 (two) times daily. 09/13/22  Yes Jeannie Fend, PA-C  fluticasone (FLONASE) 50 MCG/ACT nasal spray Place 2 sprays into both nostrils daily. 06/15/21   Domenick Gong, MD  ibuprofen (ADVIL) 600 MG tablet Take 1 tablet (600 mg total) by mouth every 6 (six) hours as needed. 06/15/21   Domenick Gong, MD  Multiple Vitamin (MULTIVITAMIN) tablet Take 1 tablet by mouth daily.    [provider]  Prenatal Vit-Fe Fumarate-FA (PRENATAL MULTIVITAMIN) TABS tablet Take 1 tablet by mouth daily.     [provider]      Allergies    Iron    Review of Systems   Review of Systems  Neurological:  Positive for headaches.   Negative except as per HPI Physical Exam Updated Vital Signs BP (!) 140/86 (BP Location: Right Arm)   Pulse 65   Temp 97.9 F (36.6 C) (Oral)   Resp 18   SpO2 98%  Physical Exam Vitals and nursing note reviewed.  Constitutional:      General: She is not in acute distress.    Appearance: She is well-developed. She is not diaphoretic.  HENT:     Head: Normocephalic and atraumatic.     Right Ear: A middle ear effusion is present. Tympanic membrane is bulging.     Left Ear: A middle ear effusion is present. Tympanic membrane is bulging.      Nose: Congestion present.  Cardiovascular:     Rate and Rhythm: Normal rate and regular rhythm.     Heart sounds: Normal heart sounds.  Pulmonary:     Effort: Pulmonary effort is normal.     Breath sounds: Normal breath sounds.  Musculoskeletal:     Cervical back: Neck supple.  Neurological:     Mental Status: She is alert and oriented to person, place, and time.  Psychiatric:        Behavior: Behavior normal.     ED Results / Procedures / Treatments   Labs (all labs ordered are listed, but only abnormal results are displayed) Labs Reviewed - No data to display  EKG None  Radiology No results found.  Procedures Procedures    Medications Ordered in ED Medications - No data to display  ED Course/ Medical Decision Making/ A&P                             Medical Decision Making  37 year old female with complaint of congestion x 10 days now with ear pain.  Found to have bilateral ear effusions, purulent effusion on the right, bulging TMs bilaterally.  Plan is to treat with amoxicillin for OM secondary to likely viral URI.  Can also do Zyrtec  and Flonase.  Recheck with PCP if not improving.        Final Clinical Impression(s) / ED Diagnoses Final diagnoses:  Viral upper respiratory tract infection  Acute otitis media, unspecified otitis media type    Rx / DC Orders ED Discharge Orders          Ordered    amoxicillin (AMOXIL) 875 MG tablet  2 times daily        09/13/22 1325              Alden Hipp 09/13/22 1459    Jacalyn Lefevre, MD 09/13/22 1520
